# Patient Record
Sex: Male | Born: 1950 | Race: White | Hispanic: No | Marital: Single | Smoking: Former smoker
Health system: Southern US, Community
[De-identification: ages and names within clinical notes are randomized; demographics above are authoritative.]

## PROBLEM LIST (undated history)

## (undated) DIAGNOSIS — I712 Thoracic aortic aneurysm, without rupture, unspecified: Secondary | ICD-10-CM

## (undated) DIAGNOSIS — N400 Enlarged prostate without lower urinary tract symptoms: Secondary | ICD-10-CM

## (undated) DIAGNOSIS — I351 Nonrheumatic aortic (valve) insufficiency: Secondary | ICD-10-CM

## (undated) DIAGNOSIS — J449 Chronic obstructive pulmonary disease, unspecified: Secondary | ICD-10-CM

## (undated) DIAGNOSIS — E785 Hyperlipidemia, unspecified: Secondary | ICD-10-CM

## (undated) HISTORY — DX: Thoracic aortic aneurysm, without rupture, unspecified: I71.20

## (undated) HISTORY — PX: OTHER SURGICAL HISTORY: SHX169

## (undated) HISTORY — DX: Nonrheumatic aortic (valve) insufficiency: I35.1

## (undated) HISTORY — PX: MITRAL VALVE REPAIR: SHX2039

## (undated) HISTORY — PX: AORTIC VALVE REPLACEMENT: SHX41

## (undated) HISTORY — DX: Hyperlipidemia, unspecified: E78.5

## (undated) HISTORY — DX: Thoracic aortic aneurysm, without rupture: I71.2

## (undated) HISTORY — PX: TRANSURETHRAL RESECTION OF PROSTATE: SHX73

## (undated) HISTORY — DX: Chronic obstructive pulmonary disease, unspecified: J44.9

## (undated) HISTORY — PX: HERNIA REPAIR: SHX51

## (undated) HISTORY — DX: Benign prostatic hyperplasia without lower urinary tract symptoms: N40.0

## (undated) HISTORY — PX: APPENDECTOMY: SHX54

---

## 1999-01-29 ENCOUNTER — Ambulatory Visit (HOSPITAL_BASED_OUTPATIENT_CLINIC_OR_DEPARTMENT_OTHER): Admission: RE | Admit: 1999-01-29 | Discharge: 1999-01-29 | Payer: Self-pay | Admitting: *Deleted

## 1999-02-13 ENCOUNTER — Emergency Department (HOSPITAL_COMMUNITY): Admission: EM | Admit: 1999-02-13 | Discharge: 1999-02-14 | Payer: Self-pay | Admitting: Emergency Medicine

## 1999-04-01 ENCOUNTER — Ambulatory Visit (HOSPITAL_COMMUNITY): Admission: RE | Admit: 1999-04-01 | Discharge: 1999-04-01 | Payer: Self-pay | Admitting: Gastroenterology

## 1999-04-01 ENCOUNTER — Encounter: Payer: Self-pay | Admitting: Gastroenterology

## 1999-04-10 ENCOUNTER — Ambulatory Visit (HOSPITAL_COMMUNITY): Admission: RE | Admit: 1999-04-10 | Discharge: 1999-04-10 | Payer: Self-pay | Admitting: Gastroenterology

## 1999-04-10 ENCOUNTER — Encounter: Payer: Self-pay | Admitting: Gastroenterology

## 1999-05-04 ENCOUNTER — Encounter (INDEPENDENT_AMBULATORY_CARE_PROVIDER_SITE_OTHER): Payer: Self-pay

## 1999-05-04 ENCOUNTER — Inpatient Hospital Stay (HOSPITAL_COMMUNITY): Admission: EM | Admit: 1999-05-04 | Discharge: 1999-05-07 | Payer: Self-pay

## 2001-01-10 ENCOUNTER — Encounter: Admission: RE | Admit: 2001-01-10 | Discharge: 2001-01-10 | Payer: Self-pay | Admitting: Family Medicine

## 2001-01-10 ENCOUNTER — Encounter: Payer: Self-pay | Admitting: Family Medicine

## 2001-01-19 ENCOUNTER — Ambulatory Visit (HOSPITAL_COMMUNITY): Admission: RE | Admit: 2001-01-19 | Discharge: 2001-01-19 | Payer: Self-pay | Admitting: Family Medicine

## 2002-01-13 ENCOUNTER — Encounter: Payer: Self-pay | Admitting: Family Medicine

## 2002-01-13 ENCOUNTER — Encounter: Admission: RE | Admit: 2002-01-13 | Discharge: 2002-01-13 | Payer: Self-pay | Admitting: Family Medicine

## 2003-01-15 ENCOUNTER — Encounter: Admission: RE | Admit: 2003-01-15 | Discharge: 2003-01-15 | Payer: Self-pay | Admitting: Family Medicine

## 2003-09-04 ENCOUNTER — Encounter: Admission: RE | Admit: 2003-09-04 | Discharge: 2003-09-04 | Payer: Self-pay | Admitting: Family Medicine

## 2003-09-07 ENCOUNTER — Ambulatory Visit (HOSPITAL_BASED_OUTPATIENT_CLINIC_OR_DEPARTMENT_OTHER): Admission: RE | Admit: 2003-09-07 | Discharge: 2003-09-07 | Payer: Self-pay | Admitting: Urology

## 2003-09-07 ENCOUNTER — Ambulatory Visit (HOSPITAL_COMMUNITY): Admission: RE | Admit: 2003-09-07 | Discharge: 2003-09-07 | Payer: Self-pay | Admitting: Urology

## 2003-09-07 ENCOUNTER — Encounter (INDEPENDENT_AMBULATORY_CARE_PROVIDER_SITE_OTHER): Payer: Self-pay | Admitting: Specialist

## 2003-09-08 ENCOUNTER — Emergency Department (HOSPITAL_COMMUNITY): Admission: EM | Admit: 2003-09-08 | Discharge: 2003-09-08 | Payer: Self-pay | Admitting: Emergency Medicine

## 2003-11-23 ENCOUNTER — Ambulatory Visit: Payer: Self-pay | Admitting: Family Medicine

## 2003-12-13 ENCOUNTER — Ambulatory Visit: Payer: Self-pay | Admitting: Family Medicine

## 2003-12-14 ENCOUNTER — Encounter: Admission: RE | Admit: 2003-12-14 | Discharge: 2003-12-14 | Payer: Self-pay | Admitting: Family Medicine

## 2004-07-09 ENCOUNTER — Ambulatory Visit: Payer: Self-pay | Admitting: Family Medicine

## 2004-12-01 ENCOUNTER — Ambulatory Visit: Payer: Self-pay | Admitting: Family Medicine

## 2004-12-12 ENCOUNTER — Ambulatory Visit: Payer: Self-pay | Admitting: Family Medicine

## 2004-12-12 ENCOUNTER — Encounter: Admission: RE | Admit: 2004-12-12 | Discharge: 2004-12-12 | Payer: Self-pay | Admitting: Family Medicine

## 2004-12-17 ENCOUNTER — Encounter: Payer: Self-pay | Admitting: Internal Medicine

## 2004-12-17 ENCOUNTER — Ambulatory Visit: Payer: Self-pay

## 2004-12-18 ENCOUNTER — Ambulatory Visit: Payer: Self-pay | Admitting: Cardiology

## 2004-12-24 ENCOUNTER — Ambulatory Visit (HOSPITAL_COMMUNITY): Admission: RE | Admit: 2004-12-24 | Discharge: 2004-12-24 | Payer: Self-pay | Admitting: Cardiology

## 2004-12-24 ENCOUNTER — Encounter: Payer: Self-pay | Admitting: Cardiology

## 2004-12-26 ENCOUNTER — Inpatient Hospital Stay (HOSPITAL_BASED_OUTPATIENT_CLINIC_OR_DEPARTMENT_OTHER): Admission: RE | Admit: 2004-12-26 | Discharge: 2004-12-26 | Payer: Self-pay | Admitting: Cardiology

## 2004-12-26 ENCOUNTER — Ambulatory Visit: Payer: Self-pay | Admitting: Cardiology

## 2005-01-09 ENCOUNTER — Encounter
Admission: RE | Admit: 2005-01-09 | Discharge: 2005-01-09 | Payer: Self-pay | Admitting: Thoracic Surgery (Cardiothoracic Vascular Surgery)

## 2005-01-13 ENCOUNTER — Ambulatory Visit: Payer: Self-pay | Admitting: *Deleted

## 2005-01-20 ENCOUNTER — Inpatient Hospital Stay (HOSPITAL_COMMUNITY)
Admission: RE | Admit: 2005-01-20 | Discharge: 2005-01-29 | Payer: Self-pay | Admitting: Thoracic Surgery (Cardiothoracic Vascular Surgery)

## 2005-01-20 ENCOUNTER — Encounter (INDEPENDENT_AMBULATORY_CARE_PROVIDER_SITE_OTHER): Payer: Self-pay | Admitting: Specialist

## 2005-01-20 ENCOUNTER — Ambulatory Visit: Payer: Self-pay | Admitting: Cardiovascular Disease

## 2005-02-02 ENCOUNTER — Ambulatory Visit: Payer: Self-pay | Admitting: Internal Medicine

## 2005-02-09 ENCOUNTER — Ambulatory Visit: Payer: Self-pay | Admitting: Cardiology

## 2005-02-16 ENCOUNTER — Ambulatory Visit: Payer: Self-pay | Admitting: Internal Medicine

## 2005-03-02 ENCOUNTER — Ambulatory Visit: Payer: Self-pay | Admitting: Cardiology

## 2005-03-11 ENCOUNTER — Ambulatory Visit: Payer: Self-pay | Admitting: Cardiology

## 2005-03-25 ENCOUNTER — Ambulatory Visit: Payer: Self-pay | Admitting: Cardiology

## 2005-03-28 ENCOUNTER — Ambulatory Visit: Payer: Self-pay | Admitting: Internal Medicine

## 2005-04-03 ENCOUNTER — Ambulatory Visit: Payer: Self-pay | Admitting: Family Medicine

## 2005-04-27 ENCOUNTER — Ambulatory Visit: Payer: Self-pay | Admitting: Internal Medicine

## 2005-04-27 ENCOUNTER — Ambulatory Visit: Payer: Self-pay | Admitting: Cardiology

## 2005-05-25 ENCOUNTER — Ambulatory Visit: Payer: Self-pay | Admitting: Cardiology

## 2005-06-17 ENCOUNTER — Ambulatory Visit: Payer: Self-pay | Admitting: Cardiology

## 2005-06-30 ENCOUNTER — Ambulatory Visit: Payer: Self-pay | Admitting: Cardiology

## 2005-07-16 ENCOUNTER — Encounter: Payer: Self-pay | Admitting: Cardiology

## 2005-07-16 ENCOUNTER — Ambulatory Visit: Payer: Self-pay

## 2005-12-24 ENCOUNTER — Ambulatory Visit: Payer: Self-pay | Admitting: Family Medicine

## 2005-12-24 LAB — CONVERTED CEMR LAB
ALT: 29 units/L (ref 0–40)
Albumin: 3.8 g/dL (ref 3.5–5.2)
Basophils Relative: 0.1 % (ref 0.0–1.0)
Chloride: 102 meq/L (ref 96–112)
GFR calc non Af Amer: 74 mL/min
Glomerular Filtration Rate, Af Am: 89 mL/min/{1.73_m2}
Glucose, Bld: 96 mg/dL (ref 70–99)
HDL: 58.6 mg/dL (ref >39.0)
LDL Cholesterol: 119 mg/dL — ABNORMAL HIGH (ref 0–99)
MCHC: 33.2 g/dL (ref 30.0–36.0)
MCV: 95.1 fL (ref 78.0–100.0)
Monocytes Absolute: 0.5 10*3/uL (ref 0.2–0.7)
Neutro Abs: 2.3 10*3/uL (ref 1.4–7.7)
Platelets: 214 10*3/uL (ref 150–400)
RBC: 4.43 M/uL (ref 4.22–5.81)
Sodium: 137 meq/L (ref 135–145)
VLDL: 11 mg/dL (ref 0–40)
WBC: 4 10*3/uL — ABNORMAL LOW (ref 4.5–10.5)

## 2005-12-31 ENCOUNTER — Ambulatory Visit: Payer: Self-pay | Admitting: Family Medicine

## 2006-02-12 ENCOUNTER — Encounter
Admission: RE | Admit: 2006-02-12 | Discharge: 2006-02-12 | Payer: Self-pay | Admitting: Thoracic Surgery (Cardiothoracic Vascular Surgery)

## 2006-04-16 ENCOUNTER — Ambulatory Visit: Payer: Self-pay | Admitting: Internal Medicine

## 2006-06-17 ENCOUNTER — Ambulatory Visit: Payer: Self-pay | Admitting: Cardiology

## 2006-08-12 ENCOUNTER — Ambulatory Visit: Payer: Self-pay | Admitting: Family Medicine

## 2006-08-26 DIAGNOSIS — J309 Allergic rhinitis, unspecified: Secondary | ICD-10-CM | POA: Insufficient documentation

## 2006-08-26 DIAGNOSIS — J4489 Other specified chronic obstructive pulmonary disease: Secondary | ICD-10-CM | POA: Insufficient documentation

## 2006-08-26 DIAGNOSIS — J449 Chronic obstructive pulmonary disease, unspecified: Secondary | ICD-10-CM

## 2006-08-26 DIAGNOSIS — N400 Enlarged prostate without lower urinary tract symptoms: Secondary | ICD-10-CM

## 2006-09-03 ENCOUNTER — Telehealth: Payer: Self-pay | Admitting: Family Medicine

## 2006-09-09 DIAGNOSIS — D239 Other benign neoplasm of skin, unspecified: Secondary | ICD-10-CM | POA: Insufficient documentation

## 2006-10-21 ENCOUNTER — Telehealth: Payer: Self-pay | Admitting: Family Medicine

## 2006-10-22 ENCOUNTER — Telehealth (INDEPENDENT_AMBULATORY_CARE_PROVIDER_SITE_OTHER): Payer: Self-pay | Admitting: *Deleted

## 2006-11-04 ENCOUNTER — Ambulatory Visit: Payer: Self-pay | Admitting: Family Medicine

## 2006-11-04 DIAGNOSIS — K4090 Unilateral inguinal hernia, without obstruction or gangrene, not specified as recurrent: Secondary | ICD-10-CM | POA: Insufficient documentation

## 2006-11-04 DIAGNOSIS — M545 Low back pain, unspecified: Secondary | ICD-10-CM | POA: Insufficient documentation

## 2006-12-27 ENCOUNTER — Ambulatory Visit: Payer: Self-pay | Admitting: Family Medicine

## 2006-12-27 LAB — CONVERTED CEMR LAB
AST: 30 units/L (ref 0–37)
Alkaline Phosphatase: 53 units/L (ref 39–117)
BUN: 16 mg/dL (ref 6–23)
Basophils Relative: 0.1 % (ref 0.0–1.0)
CO2: 29 meq/L (ref 19–32)
Chloride: 101 meq/L (ref 96–112)
Creatinine, Ser: 1 mg/dL (ref 0.4–1.5)
HCT: 40.8 % (ref 39.0–52.0)
Hemoglobin: 14.1 g/dL (ref 13.0–17.0)
Ketones, urine, test strip: NEGATIVE
LDL Cholesterol: 131 mg/dL — ABNORMAL HIGH (ref 0–99)
Monocytes Absolute: 0.5 10*3/uL (ref 0.2–0.7)
Neutrophils Relative %: 53.4 % (ref 43.0–77.0)
Nitrite: NEGATIVE
Potassium: 4.7 meq/L (ref 3.5–5.1)
RDW: 12.7 % (ref 11.5–14.6)
TSH: 1.41 microintl units/mL (ref 0.35–5.50)
Total Bilirubin: 1.1 mg/dL (ref 0.3–1.2)
Total Protein: 6.7 g/dL (ref 6.0–8.3)
Urobilinogen, UA: 0.2
VLDL: 14 mg/dL (ref 0–40)
WBC Urine, dipstick: NEGATIVE

## 2006-12-29 ENCOUNTER — Telehealth (INDEPENDENT_AMBULATORY_CARE_PROVIDER_SITE_OTHER): Payer: Self-pay | Admitting: *Deleted

## 2006-12-30 ENCOUNTER — Telehealth: Payer: Self-pay | Admitting: Family Medicine

## 2007-01-03 ENCOUNTER — Ambulatory Visit (HOSPITAL_COMMUNITY): Admission: RE | Admit: 2007-01-03 | Discharge: 2007-01-04 | Payer: Self-pay | Admitting: General Surgery

## 2007-01-10 ENCOUNTER — Ambulatory Visit: Payer: Self-pay | Admitting: Family Medicine

## 2007-01-11 ENCOUNTER — Ambulatory Visit: Payer: Self-pay | Admitting: Family Medicine

## 2007-03-18 ENCOUNTER — Telehealth (INDEPENDENT_AMBULATORY_CARE_PROVIDER_SITE_OTHER): Payer: Self-pay | Admitting: *Deleted

## 2007-03-24 ENCOUNTER — Ambulatory Visit: Payer: Self-pay | Admitting: Internal Medicine

## 2007-04-14 ENCOUNTER — Telehealth: Payer: Self-pay | Admitting: Family Medicine

## 2007-04-14 ENCOUNTER — Encounter: Payer: Self-pay | Admitting: Cardiology

## 2007-06-28 ENCOUNTER — Ambulatory Visit: Payer: Self-pay | Admitting: Cardiology

## 2007-07-05 ENCOUNTER — Encounter: Payer: Self-pay | Admitting: Cardiology

## 2007-07-05 ENCOUNTER — Ambulatory Visit: Payer: Self-pay | Admitting: Cardiology

## 2007-09-05 IMAGING — CR DG CHEST 2V
2 series · 2 of 2 positions shown · non-contrast
Comparison: 12/14/03.

CLINICAL DATA: Irregular heartbeat.
 CHEST X-RAY:

[view not recorded (1 of 2)]
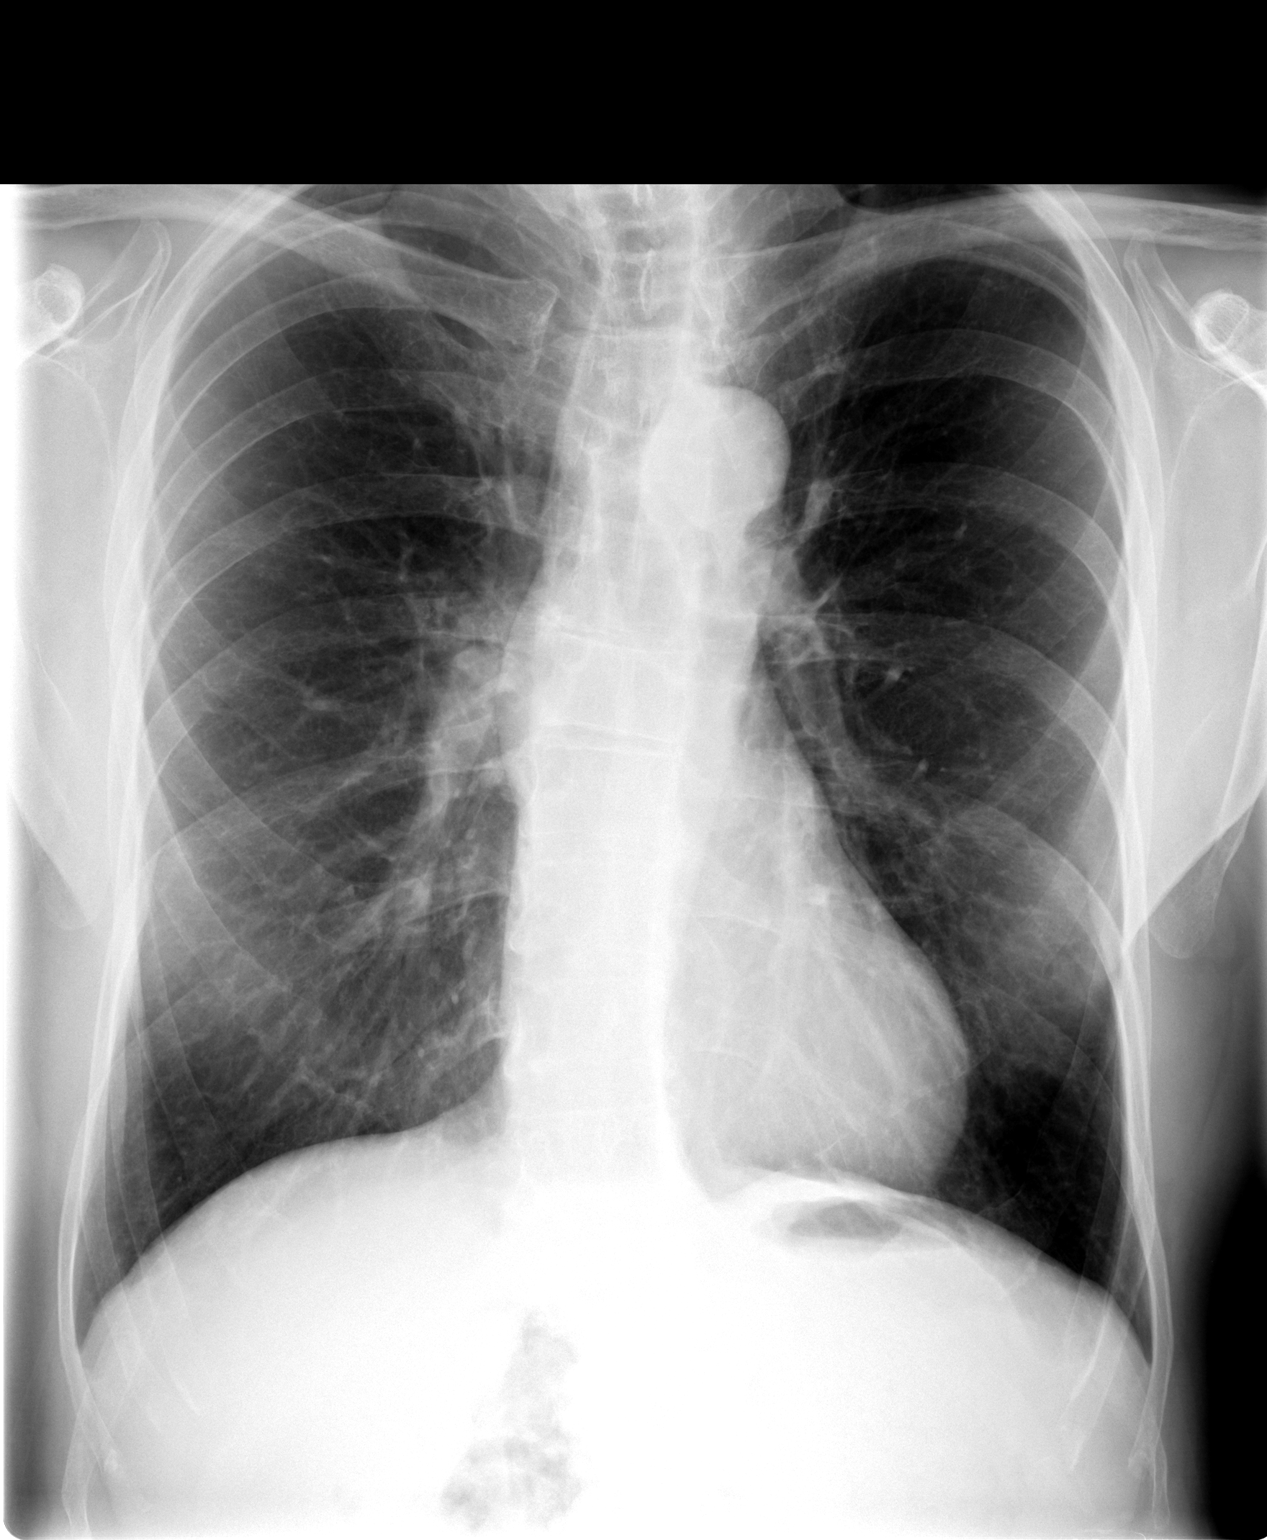

[view not recorded (2 of 2)]
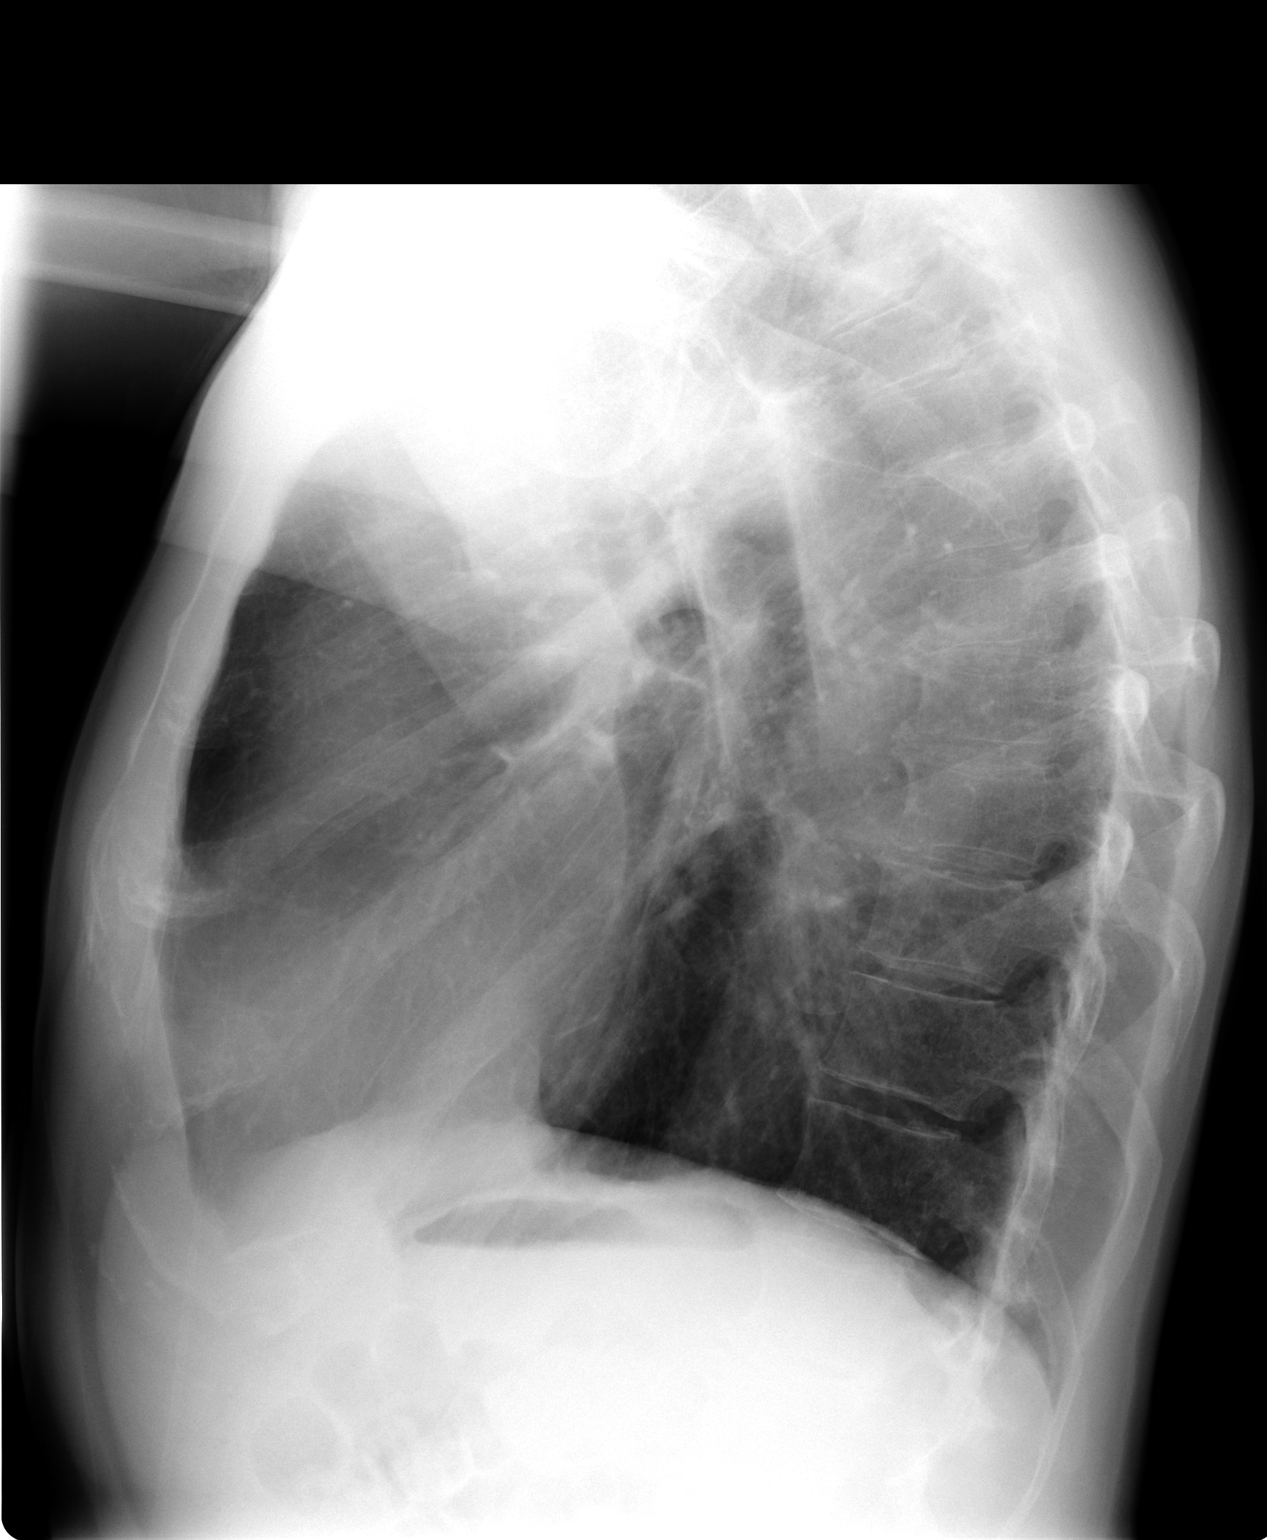

[2 of 2 positions shown; findings below may reference images not displayed]

Two views of the chest show the lungs to be hyperaerated consistent with COPD.  The heart is within normal limits in size.  There is a mild thoracolumbar scoliosis present.
IMPRESSION: COPD.  No active lung disease.

## 2007-10-18 ENCOUNTER — Ambulatory Visit: Payer: Self-pay | Admitting: Family Medicine

## 2008-01-03 ENCOUNTER — Ambulatory Visit: Payer: Self-pay | Admitting: Family Medicine

## 2008-01-03 LAB — CONVERTED CEMR LAB
ALT: 32 units/L (ref 0–53)
AST: 28 units/L (ref 0–37)
Albumin: 3.9 g/dL (ref 3.5–5.2)
Alkaline Phosphatase: 55 units/L (ref 39–117)
BUN: 18 mg/dL (ref 6–23)
Basophils Relative: 0 % (ref 0.0–3.0)
Blood in Urine, dipstick: NEGATIVE
CO2: 31 meq/L (ref 19–32)
Chloride: 101 meq/L (ref 96–112)
Creatinine, Ser: 0.9 mg/dL (ref 0.4–1.5)
Direct LDL: 122.5 mg/dL
Eosinophils Relative: 1.1 % (ref 0.0–5.0)
GFR calc non Af Amer: 92 mL/min
HDL: 58.1 mg/dL (ref >39.0)
Lymphocytes Relative: 31.2 % (ref 12.0–46.0)
Monocytes Relative: 10.2 % (ref 3.0–12.0)
Neutrophils Relative %: 57.5 % (ref 43.0–77.0)
Nitrite: NEGATIVE
Platelets: 179 10*3/uL (ref 150–400)
Potassium: 3.8 meq/L (ref 3.5–5.1)
Protein, U semiquant: NEGATIVE
RBC: 4.23 M/uL (ref 4.22–5.81)
Specific Gravity, Urine: 1.02
Total CHOL/HDL Ratio: 3.5
Triglycerides: 72 mg/dL (ref 0–149)
Urobilinogen, UA: 0.2
VLDL: 14 mg/dL (ref 0–40)
WBC Urine, dipstick: NEGATIVE
WBC: 3.9 10*3/uL — ABNORMAL LOW (ref 4.5–10.5)

## 2008-01-09 ENCOUNTER — Encounter: Payer: Self-pay | Admitting: Family Medicine

## 2008-01-10 ENCOUNTER — Encounter: Payer: Self-pay | Admitting: Family Medicine

## 2008-01-10 ENCOUNTER — Ambulatory Visit: Payer: Self-pay | Admitting: Family Medicine

## 2008-01-10 DIAGNOSIS — I059 Rheumatic mitral valve disease, unspecified: Secondary | ICD-10-CM | POA: Insufficient documentation

## 2008-02-09 ENCOUNTER — Ambulatory Visit: Payer: Self-pay | Admitting: Family Medicine

## 2008-02-17 ENCOUNTER — Telehealth: Payer: Self-pay | Admitting: Family Medicine

## 2008-04-16 ENCOUNTER — Telehealth: Payer: Self-pay | Admitting: Family Medicine

## 2008-06-20 ENCOUNTER — Ambulatory Visit: Payer: Self-pay | Admitting: Cardiology

## 2008-06-20 DIAGNOSIS — E785 Hyperlipidemia, unspecified: Secondary | ICD-10-CM | POA: Insufficient documentation

## 2008-06-20 DIAGNOSIS — I359 Nonrheumatic aortic valve disorder, unspecified: Secondary | ICD-10-CM | POA: Insufficient documentation

## 2008-06-22 ENCOUNTER — Encounter: Payer: Self-pay | Admitting: Cardiology

## 2008-06-22 ENCOUNTER — Ambulatory Visit: Payer: Self-pay

## 2008-11-09 ENCOUNTER — Telehealth (INDEPENDENT_AMBULATORY_CARE_PROVIDER_SITE_OTHER): Payer: Self-pay | Admitting: *Deleted

## 2010-02-02 ENCOUNTER — Encounter: Payer: Self-pay | Admitting: Thoracic Surgery (Cardiothoracic Vascular Surgery)

## 2010-05-27 NOTE — Assessment & Plan Note (Signed)
[facility]                            CARDIOLOGY OFFICE NOTE   [name], [name]                      MRN:          [id number]  DATE:06/28/2007                            DOB:          [birth year]    PRIMARY CARE PHYSICIAN:  Salvatore Decent. Cornelius Moras, M.D.   REASON FOR PRESENTATION:  The patient with mitral valve repair.   HISTORY OF PRESENT ILLNESS:  The patient is a pleasant 60 year old  gentleman with a history of mitral valve regurgitation, status post  repair.  He also had atrial fibrillation.  From a cardiovascular stand  point, he has been doing well.  He occasionally gets a sensation that he  cannot take in deep breath, like he has to yawn or sigh.  However, this  was not dyspnea with exertion.  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  He does not have any chest  pressure, neck or arm discomfort.  His has not noticed any palpitation,  presyncope or syncope.   PAST MEDICAL HISTORY:  Mitral valve status post repair (January 20, 2005  by Dr. Cornelius Moras, with a quadrangular resection of the posterior leaflet with  sliding leaflet annuloplasty, triangular plication of the anterior  leaflet and a #32 Medtronic ring annuloplasty), atrial fibrillation  status post modified Cox Maze procedure, benign prostatic hypertrophy,  laminectomy, vasectomy, appendectomy, and right inguinal hernia x2, and  mild aortic root dilatation.   ALLERGIES:  None.   MEDICATIONS:  1. Aspirin 81 mg daily.  2. Saw Palmetto.  3. Multivitamin.  4. Flomax 0.4 mg every other day.   REVIEW OF SYSTEMS:  As stated in the HPI and otherwise negative for  other systems.   PHYSICAL EXAMINATION:  The patient is in no distress. Blood pressure  129/80, heart rate 69 and regular, weight 173 pounds, and body mass  index 27.  HEENT:  Eyes unremarkable, pupils equal, round, and  react to light.  Fundi not visualized.  Oral mucosa unremarkable.  NECK:  No jugular venous distension at 45 degrees, carotid upstroke  brisk and symmetrical.  No bruit.  No thyromegaly.  LYMPHATICS:  No cervical, axillary, or inguinal adenopathy.  LUNGS:  Clear to auscultation bilaterally.  BACK:  No costovertebral angle tenderness.  CHEST:  Unremarkable.  HEART:  PMI not displaced or sustained.  S1 and S2 within normal limits.  No S3, no S4, 3/6 apical systolic murmur slightly delayed in systole,  radiating slightly to the axilla, no diastolic murmurs.  ABDOMEN:  Flat, positive bowel sounds, normal in frequency and pitch.  No bruits, rebound, or guarding.  No midline pulsatile mass.  No  hepatomegaly, or splenomegaly.  SKIN:  No rashes.  No nodules.  EXTREMITIES:  Pulses 2+ throughout, no edema, no cyanosis or clubbing.  NEURO:  Alert and oriented x3.  Cranial nerves II through XII grossly  intact.  Motor, grossly intact.   EKG (done in April), sinus rhythm, incomplete right bundle-branch block,  anterior  T-wave inversions, this was slightly changed from his 2008 EKG.   ASSESSMENT AND PLAN:  1. Mitral regurgitation.  The patient's murmur is slightly louder than      it was described by Dr. Samule Ohm.  I have not heard this murmur      before.  It has been 2 years since his last echocardiogram.  I will      go ahead and repeat an echocardiogram to further evaluate his      mitral regurgitation.  This will also allow me to size his aortic      root to make sure there has been no significant change.  He did      have a CT in 2008, which demonstrated to be slightly enlarged at      4.1 cm.  2. Dyspnea.  The patient describes having to take a deep breath, but      not overt dyspnea on exertion, PND, or orthopnea.  I doubt that      this is related to his cardiac problems.  I am going to encourage      him to slowly increase his aerobic activity and train himself and I      think his  stamina and breathing will improve.  3. Atrial fibrillation.  He has had no paroxysms of this.  He was      taken off the Coumadin by Dr. Samule Ohm.  4. Followup.  I will see him back yearly unless there is a problem      with the echocardiogram.     Rollene Rotunda, MD, [facility]  Electronically Signed    JH/MedQ  DD: 06/28/2007  DT: 06/29/2007  Job #: [id number]   cc:   Tinnie Gens A. Tawanna Cooler, MD

## 2010-05-27 NOTE — Assessment & Plan Note (Signed)
[facility]                            CARDIOLOGY OFFICE NOTE   [name], [name]                      MRN:          [id number]  DATE:06/17/2006                            DOB:          [birth year]    PRIMARY CARE PHYSICIAN:  Dr. Alonza Smoker.   HISTORY OF PRESENT ILLNESS:  [name] is a 60 year old gentleman,  status post mitral valve repair, mitral annuloplasty, and modified Cox-  Maze procedure by Dr. Cornelius Moras in December 2006.  His postoperative course  has been uncomplicated.  He has had no recurrence of the atrial  fibrillation after the surgery.  We obtained a Holter monitor a year ago  that showed no atrial fibrillation.  He has certainly had no  palpitations.  He further denies any exertional dyspnea.   CURRENT MEDICATIONS:  1. Aspirin 81 mg daily.  2. Saw palmetto.  3. Multivitamin.  4. Flomax.  5. Proscar.   EXAMINATION:  He is generally well-appearing, in no distress with a  heart rate of 74, blood pressure 108/82, and weight of 172 pounds.  He  has no jugular venous distention, thyromegaly, or lymphadenopathy.  LUNGS:  Clear to auscultation and respiratory effort is normal.  He has a nondisplaced point of maximal cardiac impulse.  There is a  regular rate and rhythm with a 1/6 holosystolic murmur heard at the apex  only.  There is no click, S3, or S4.  ABDOMEN:  Soft, nondistended, nontender.  There is no  hepatosplenomegaly.  Bowel sounds are normal.  EXTREMITIES:  Warm without clubbing, cyanosis, edema, or ulceration.  Carotid pulses are 2+ bilaterally without bruit.   Electrocardiogram demonstrates normal sinus rhythm with left anterior  fascicular block.  There is no change compared with prior.   IMPRESSION/RECOMMENDATION:  1. Status post mitral valve repair:  Doing very nicely.  He does not      need antibiotic prophylaxis for dental work.  2. Paroxysmal atrial fibrillation:  He has remained in sinus rhythm      since his  surgery.  Therefore no need for Coumadin.     Salvadore Farber, MD  Electronically Signed    WED/MedQ  DD: 06/17/2006  DT: 06/17/2006  Job #: [id number]   cc:   Tinnie Gens A. Tawanna Cooler, MD

## 2010-05-27 NOTE — Op Note (Signed)
NAME:  [name], [name]               ACCOUNT NO.:  [id number]   MEDICAL RECORD NO.:  [id number]          PATIENT TYPE:  AMB   LOCATION:  DAY                          FACILITY:  [facility]   PHYSICIAN:  Anselm Pancoast. Weatherly, M.D.DATE OF BIRTH:  28-Apr-1950   DATE OF PROCEDURE:  01/03/2007  DATE OF DISCHARGE:                               OPERATIVE REPORT   PREOPERATIVE DIAGNOSIS:  Left inguinal hernia, probable direct.   PROCEDURE:  Left inguinal herniorrhaphy with mesh, Lichtenstein repair.   ANESTHESIA:  General.  LOA tube.   SURGEON:  Anselm Pancoast. Zachery Dakins, M.D.   ASSISTANT:  Nurse.   HISTORY:  [name] is a 60 year old male who Dr. Colin Benton had done a  previous right inguinal hernia laparoscopically, and then he has had a  laparoscopic appendectomy.  He was scheduled for a left open inguinal  hernia repair today.  With her absence, I was asked if I would do the  procedure.  I met the patient in the office and examined him and agreed  with the planned procedure.  We think that this is a direct inguinal  hernia, and we will use Prolene mesh after repairing the hernia.   DESCRIPTION OF PROCEDURE:  The patient was taken to the operative suite.  He was given a gram of Ancef.  He has PAS stockings in position on the  OR table.  We then induced anesthesia with an LOA tube, and the left  groin was first clipped and then prepped with Betadine solution and  draped in a sterile manner.  The timeout procedure was done.  The  patient had been marked and identified, etc.   I then made a left inguinal incision with sharp dissection down through  the skin and subcutaneous tissue.  There were a couple of superficial  veins that were quite large that were clamped and ligated with 4-0  Vicryl, and then the external oblique aponeurosis was opened through the  external inguinal ring.  The ilioinguinal nerve was identified and  placed with the cord structures, and you could see that this was  definitely a direct inguinal hernia with no evidence of an indirect  component.  I first elevated the cord structures and then carefully  dissected the hernia sac from where it was lying up against the cord  structures and took it back to the epigastric vessels.  I elected to  invaginate the hernia sac and not actually excise it since it was just  peritoneum and found the lateral edge of the conjoined tendon and  sutured this to the inguinal ligament with a running 2-0 Prolene,  recreating the internal inguinal ring and then taking it back and tying  the two ends together.  Next, a piece of Prolene mesh shaped like a sail  slit laterally was used to reinforce the floor.  I sutured it to the  inguinal ligament inferiorly and then the two tails going around the new  created internal ring, and the ilioinguinal nerve was with it.  The  little hypogastric was high enough that I could not place the mesh over  it.  The superior flap was sutured down with interrupted sutures of 2-0  Vicryl or 2-0 Prolene, and it was lying flat without excessive tension.  I had placed some Marcaine in the floor and also a little lateral for  immediate postoperative pain control management.  The external oblique  was closed with a 2-0 Vicryl.  Scarpa's fascia was closed with  interrupted 3-0 Vicryl, 4-0 Dexon subcuticular, and benzoin and Steri-  Strips on the skin.   The patient tolerated the procedure nicely and will be released after a  short stay in the recovery room.  He understands that he should be on  limited activity for approximately 3 weeks, and I will see him back in  the office in approximately 2 weeks, earlier if he has any problems.  I  think Dr. Alonza Smoker is his regular physician.           ______________________________  Anselm Pancoast. Zachery Dakins, M.D.     WJW/MEDQ  D:  01/03/2007  T:  01/03/2007  Job:  [id number]

## 2010-05-30 NOTE — Cardiovascular Report (Signed)
[name], [name]               ACCOUNT NO.:  [id number]   MEDICAL RECORD NO.:  [id number]          PATIENT TYPE:  OIB   LOCATION:  1963                         FACILITY:  [facility]   PHYSICIAN:  Rollene Rotunda, M.D.   DATE OF BIRTH:  [birth year]   DATE OF PROCEDURE:  12/26/2004  DATE OF DISCHARGE:  12/26/2004                              CARDIAC CATHETERIZATION   CARDIOLOGIST:  Salvadore Farber, M.D. [facility]   PROCEDURE:  Left and right heart catheterization and aortic root.   INDICATION:  Mitral regurgitation.   PROCEDURE NOTE:  Left and right heart catheterization were performed via the  right femoral artery and vein, respectively.  Both vessels were cannulated  using an antral wall puncture.  A #4 French arterial sheath and a #7 [place]  venous sheath were inserted via the modified Seldinger technique.  Pre-  formed Judkins, pigtail, Amplatz, left catheter and a Swan-Ganz catheter  were utilized.  The right coronary artery had a high anterior take off and  was easily cannulated with an Amplatz left catheter after the take off was  visualized with an aortic root injection.  I had difficulty crossing the  aortic valve for unclear reasons.  I eventually had to use a right coronary  catheter with a straight wire followed by long exchange wire.   RESULTS:  Hemodynamics:  RA mean 7, RV 20/4, PA 17/4 with a mean of 10.  Pulmonary capillary wedge pressure mean of 9 (no V-wave), cardiac  output/cardiac index 4.4/2.2 (FICK), LV 124/14, ____________ 112/68.  Coronaries:  Left main was normal.  The LAD had minimal luminal  irregularities.  First and second diagonal were small and normal.  Circumflex in the AV groove was normal.  OM1 was small and normal.  OM2 was  large and normal.  Right coronary artery was dominant with high anterior  wall take off.  There was minimal plaque.  The PDA was moderate sized and  normal.   Left ventriculogram:  The left ventriculogram was obtained in the RAO  projection.  There was mild global hypokinesis with an ejection fraction  overall relatively well preserved at 50-55%.  There was posterior mitral  valve leaflet prolapse.  However, the regurgitation from the ventriculogram  appeared to be only 1-2+.  Aortic root:  An aortic root was obtained  secondary to difficulty cannulating the right coronary.  It confirmed the  high anterior wall takeoff and no other significant abnormalities.   CONCLUSION:  Minimal coronary plaque.  Moderate appearing mitral  regurgitation with prolapsing mitral valve, mildly reduced left ventricular  function, normal right heart pressures.   PLAN:  These results will be reviewed with Dr. Samule Ohm and Dr. Cornelius Moras to  further help with the decision on the timing of mitral valve repair.           ______________________________  Rollene Rotunda, M.D.    JH/MEDQ  D:  12/26/2004  T:  12/29/2004  Job:  [id number]

## 2010-05-30 NOTE — Consult Note (Signed)
[name], [name]               ACCOUNT NO.:  [id number]   MEDICAL RECORD NO.:  [id number]          PATIENT TYPE:  INP   LOCATION:  2310                         FACILITY:  [facility]   PHYSICIAN:  Zenon Mayo, MDDATE OF BIRTH:  1950/02/17   DATE OF CONSULTATION:  01/20/2005  DATE OF DISCHARGE:                                   CONSULTATION   DESCRIPTION:  [name] is a 60 year old gentleman with history of  progressive worsening of shortness of breath who was evaluated and found to  have mitral regurgitation. He also has a history of atrial fibrillation and  dilated left atrium. [name] was brought to the operating room today by  Dr. Cornelius Moras for repair or replacement of his mitral valve as well as a Maze  procedure for correction of his atrial fibrillation. The patient was brought  to the operating room and placed under general anesthesia. After  confirmation of endotracheal tube placement, a transesophageal  echocardiogram probe was placed in the patient's esophagus without  complication. The left ventricle was imaged first and revealed normal wall  thickness. There were no segmental wall motion abnormalities noted. The  ejection fraction was estimated to be 55%. The left ventricle, was mildly to  moderately dilated. The mitral valve was next evaluated. The anterior mitral  leaflet appeared thickened. The posterior leaflet revealed billowing into  the left atrium. The leaflets did not appear to coapt. When color Doppler  was placed across the valve, both a large central jet as well as eccentric  jet passing over the anterior leaflet were noted. The mitral regurgitation  jet was classified as severe in nature. There was no mitral annular  calcification noted. It was difficult to assess whether there was reversal  of flow into the pulmonic veins. The aorta was evaluated next. The aortic  valve was trileaflet in nature. There were no calcifications or vegetation  seen. The  aortic valve area was 6.7 cm2. There was mild aortic insufficiency  noted within an eccentric jet located between the left and noncoronary  cusps. The aortic root was significantly dilated as well with annular  measurement of 31 mm, the sinuses Valsalva measured 48 mm, and sinotubular  ridge measured 39 mm. The ascending aorta itself measured 40 mm. The  tricuspid and pulmonic valves were both normal in appearance with trace  amounts of regurgitation seen. The interatrial septum was intact and the  thoracic aorta revealed no atherosclerotic disease.   At the conclusion of bypass, the heart was again evaluated with  echocardiogram. A ring had been placed in the mitral position and appeared  to be seen well. There was no mitral regurgitation noted at this time. The  aortic insufficiency remained mild in nature and continued to have an  eccentric jet. All other structures and functions of the heart remained  unchanged from prebypass evaluation. The patient maintained adequate blood  pressure and cardiac outputs without having been placed on any inotropic  support. The patient remained stable throughout his post bypass. At the end  of the procedure, the transesophageal echocardiogram probe was removed  without any evidence of trauma. The patient was taken directly from the  operating room to the intensive care unit in stable condition.           ______________________________  Zenon Mayo, MD    WEF/MEDQ  D:  01/20/2005  T:  01/20/2005  Job:  [id number]   cc:   Patient's chart   Anesthesia Office

## 2010-05-30 NOTE — Consult Note (Signed)
NAMEGIULIANO, PREECE                         ACCOUNT NO.:  [id number]   MEDICAL RECORD NO.:  [id number]                   PATIENT TYPE:  EMS   LOCATION:  ED                                   FACILITY:  [facility]   PHYSICIAN:  Maretta Bees. Vonita Moss, M.D.             DATE OF BIRTH:  15-Aug-1950   DATE OF CONSULTATION:  DATE OF DISCHARGE:                                   CONSULTATION   HISTORY OF PRESENT ILLNESS:  This 60 year old white male is a patient of Dr.  Ihor Gully and underwent cystoscopy, ureteroscopy, and biopsy yesterday  and he called me earlier this morning complaining about a lot of bladder  pain and pressure and trouble voiding, so I called him in Urocet and Flomax.  His pain got worse so he came to the emergency room where I saw him and  Foley catheter was inserted with return of 1000 cc of slightly blood-tinged  urine with significant relief of his pain and discomfort.   PHYSICAL EXAMINATION:  GENERAL:  He was much more comfortable, alert and  oriented.  ABDOMEN:  Now soft and nontender with a Foley catheter in.  GENITALIA:  Penis, urethra, meatus, scrotum, and testicles, and epididymes  without lesions.   IMPRESSION:  1. Postoperative urinary retention.  2. Hematuria from biopsy.  3. Renal blading with pathology pending.   PLAN:  He will continue on Flomax daily. He will use Vicodin for pain.  He  will use Urocet 1-2 q.i.d. over the next couple days until he takes out the  catheter per my instructions on Monday morning, August 29, at which time he  will return to the office if he cannot void but otherwise return for follow-  up per Dr. Cleon Gustin instructions.                                               Maretta Bees. Vonita Moss, M.D.    LJP/MEDQ  D:  09/08/2003  T:  09/09/2003  Job:  [id number]

## 2010-05-30 NOTE — Discharge Summary (Signed)
[name], [name]               ACCOUNT NO.:  [id number]   MEDICAL RECORD NO.:  [id number]          PATIENT TYPE:  INP   LOCATION:  2030                         FACILITY:  [facility]   PHYSICIAN:  Salvatore Decent. Cornelius Moras, M.D. DATE OF BIRTH:  Jun 07, 1950   DATE OF ADMISSION:  01/20/2005  DATE OF DISCHARGE:                                 DISCHARGE SUMMARY   PRINCIPAL DIAGNOSES:  1.  Mitral valve prolapse with severe mitral regurgitation.  2.  Persistent atrial fibrillation.   IN-HOSPITAL DIAGNOSES:  1.  Acute blood loss anemia.  2.  Volume overload.   SECONDARY DIAGNOSES:  1.  Benign prostatic hypertrophy.  2.  History of gross hematuria in 2005, resolved.  3.  Status post lumbar laminectomy and vasectomy approximately 20 years ago.  4.  Status post appendectomy.  5.  Status post right inguinal hernia repair.   ALLERGIES:  No known drug allergies.   IN-HOSPITAL OPERATION/PROCEDURE:  1.  Mitral valve repair using a quadrangular resection of the posterior      leaflet with sliding leaflet annuloplasty, triangular plication of the      anterior leaflet, 32-mm Medtronic ECG future ring annuloplasty.  2.  Modified Cox Maze IV procedure.  3.  Transesophageal echocardiogram.   HISTORY AND PHYSICAL/HOSPITAL COURSE:  The patient is a 60-year-old gentleman  followed by Dr. Tawanna Cooler, seen by Dr. Samule Ohm, for management of her mitral valve  prolapse with mitral regurgitation and atrial fibrillation. The patient  recently developed vague shortness of breath with activity and a new heart  murmur was noted on physical exam by Dr. Tawanna Cooler. The patient underwent  echocardiogram on December 17, 2004, demonstrating mitral valve prolapse with  moderate to severe mitral regurgitation and dilatation of the left atrium.  The patient then underwent transesophageal echocardiogram on December 24, 2004, confirming the presence of maximized mitral valve disease with  bileaflet prolapse and moderate to severe mitral  regurgitation. There was  also somewhat dilated aortic root with only a trace aortic insufficiency.  The patient underwent cardiac catheterization in December 2006 which  confirmed the presence of mitral regurgitation. There was no significant  coronary artery disease noted. Dr. Cornelius Moras was consulted. Dr. Cornelius Moras discussed  with patient undergoing repair of the mitral valve. He also discussed with  patient doing a Maze procedure at the same time for his persistent atrial  fibrillation. Dr. Cornelius Moras discussed risks and benefits of this procedure. The  patient acknowledged understanding and wished to procedure. Surgery was  scheduled for January 20, 2005. Prior to undergoing surgery the patient  underwent bilateral carotid duplex ultrasound which showed no significant IC  stenosis.   The patient was taken to the operating room on January 20, 2005, where he  underwent median sternotomy for mitral valve repair, with a quadrangular  resection posterior leaflet with sliding leaflet annuloplasty, triangular  plication anterior leaflet, 32-mm Medtronic ECG future ring annuloplasty.  The patient also underwent modified Cox Maze 4 procedure. The patient  tolerated this procedure well and was transferred up to the intensive care  unit in stable condition. Immediately following surgery the  patient was  deemed to be stable. He was extubated on the evening of surgery and was  doing well. The patient was A-paced at 90 the evening of surgery. Following  surgery the patient's chest tubes and lines were discontinued in the normal  fashion. There is a question of bleeding on postoperative day one due to the  patient dumping 500 mL of old blood. Chest x-ray was obtained prior to the  chest tubes being discontinued, showing a right pleural opacity which was  noted in the prior exam. The patient's hemoglobin and hematocrit had been  unchanged. Chest tubes were discontinued as ordered. The patient did develop  acute blood  loss anemia during his hospital stay. It dropped down to 7.7 and  22.0 on postoperative day four. He received one unit of packed red blood  cells. It increased appropriately on postoperative day five to 8.3 and 24.1.  H&H was stable prior to discharge. The patient also developed volume  overload for which he started on diuretics for. Last weight was 178.6 and  his perioperative weight was 169.  Plan to discharge home on diuretics.  Following the Maze procedure the patient remained in normal sinus rhythm. He  did require a pacing for several days postoperatively, but remained in  normal sinus rhythm. The patient was started back on his amiodarone. The  patient was on normal sinus rhythm at discharge. The patient was started on  Coumadin postoperatively for the mitral ring. PT-INR were obtained during  this hospital stay and Coumadin was adjusted appropriately. The patient was  near/therapeutic prior to discharge home.  The patient's incisions were dry,  intact, and healing well postoperatively. He was out of bed and ambulating  well. He did develop nausea postoperatively. This was treated with Phenergan  and the patient was free of nausea prior to discharge home, tolerating diet  well. The patient was able to be weaned off oxygen saturating greater than  90% on room air.   The patient is tentatively ready for discharge home on postoperative day  eight, January 28, 2005. A follow-up appointment has been scheduled with Dr.  Cornelius Moras for February 23, 2005, at 2:30 p.m.  The patient will need to contact  Dr. Melinda Crutch office, schedule a follow-up appointment with him in two weeks.  The patient will have a PA and lateral chest x-ray at this appointment which  she will then bring with him to Dr. Orvan July appointment. Mr. Walthall received  instructions on diet, activity level, and incisional care. He was told no driving until released to do so, no heavy lifting over 10 pounds. The  patient is told he is  allowed to shower, washing his incisions using soap  and water. He is to contact the office if he develops any drainage or  opening from any of his incision sites. The patient is told to ambulate 3-4  times per day, to progress as tolerated, and to continue his breathing  exercises. He was educated on diet to be low fat, low salt. He acknowledges  understanding.   DISCHARGE MEDICATIONS:  1.  Aspirin 325 mg daily.  2.  Amiodarone 200 mg daily.  3.  Flomax 0.4 mg daily.  4.  Coumadin will be dosed prior to patient's discharge PT-INR levels. The      patient's Coumadin dosing will be managed by Dr. Samule Ohm and the patient      will need to obtain a PT-INR two days following discharge at the [facility].  5.  Lasix 40 mg daily times five days.  6.  Potassium chloride 20 mEq daily times five days.  7.  Multivitamin daily.  8.  Ultram 50 mg one to two tabs p.o. q.4-6h. p.r.n. pain.      Theda Belfast, PA      Salvatore Decent. Cornelius Moras, M.D.  Electronically Signed    KMD/MEDQ  D:  01/27/2005  T:  01/27/2005  Job:  [id number]   cc:   Salvadore Farber, M.D. [facility]  [address]  [address]  [place]  [place] [address]

## 2010-05-30 NOTE — Op Note (Signed)
[place]. [facility]  Patient:    [name]                       MRN: [id number] Proc. Date: 01/29/99 Adm. Date:  [id number] Attending:  Stephenie Acres                           Operative Report  PREOPERATIVE DIAGNOSIS:  Right inguinal hernia.  POSTOPERATIVE DIAGNOSIS:  Right inguinal hernia.  PROCEDURE:  Laparoscopic right inguinal hernia repair with mesh.  SURGEON:  Catalina Lunger, M.D.  ANESTHESIA:  General.  DESCRIPTION OF PROCEDURE:  The patient was taken to the operating room and placed in supine position.  After adequate anesthesia was induced using endotracheal tube, the abdomen was prepped and draped in normal sterile fashion.  Using a transverse infraumbilical incision, I dissected down into the right anterior rectus fascia. This was opened transversely with a small incision.  All right rectus muscle fibers were retracted laterally.  Using blunt dissection a space was created using a Kelly clamp down through the pubic tubercle.  Dissecting balloon was then placed within the space.  Under direct visualization he was insufflated manually.  Epigastric  vessels were seen, as well as the direct hernia defect.  This was accomplished,  again without difficulty.  The balloon was removed and the operating balloon was placed.  After pneumoperitoneum was obtained, two 5 mm trocars were placed just  left of the midline under direct visualization.  Coopers ligament was cleared, s well as the pubic tubercle.  A direct hernia defect was identified and pseudosac was dissected free of its surrounding structures.  Epigastric vessels were identified.  The spermatic cord was identified and inspected.  There was no evidence of indirect hernia defect.  After all structures were identified a piece of 4 x 6 mesh was then placed.  It was tacked to Coopers ligament anteriorly to the entire abdominal musculature, both medially and  laterally to the epigastric vessels, out to the anterior superior iliac spine.  Pneumoperitoneum was released. Dissecting balloon was removed.  Fascial defect was closed using 0 Vicryl suture. Skin incisions were closed with subcuticular 4.0 monocryl.  Steri-Strips and sterile dressings were applied.  The patient tolerated the procedure well.  Went to PACU in good condition. DD:  01/29/99 TD:  01/30/99 Job: [id number]

## 2010-05-30 NOTE — Op Note (Signed)
[name], [name]               ACCOUNT NO.:  [id number]   MEDICAL RECORD NO.:  [id number]          PATIENT TYPE:  INP   LOCATION:  2310                         FACILITY:  [facility]   PHYSICIAN:  Salvatore Decent. Cornelius Moras, M.D. DATE OF BIRTH:  05/30/1950   DATE OF PROCEDURE:  01/20/2005  DATE OF DISCHARGE:                                 OPERATIVE REPORT   PREOPERATIVE DIAGNOSIS:  Mitral valve prolapse with severe mitral  regurgitation, persistent atrial fibrillation.   POSTOPERATIVE DIAGNOSIS:  Mitral valve prolapse with severe mitral  regurgitation, persistent atrial fibrillation.   PROCEDURE:  Median sternotomy for mitral valve repair (quadrangular  resection of the posterior leaflet with sliding leaflet annuloplasty,  triangular plication of the anterior leaflet, 32 mm Medtronic ECG future  ring annuloplasty) and modified Cox maze IV procedure.   SURGEON:  Salvatore Decent. Cornelius Moras, M.D.   ASSISTANT:  Jerold Coombe, P.A.   ANESTHESIA:  General   CLINICAL NOTE:  The patient is a 60 year old gentleman from [place]  followed by Dr. Ovidio Hanger, referred by Dr. Randa Evens for  management of mitral valve prolapse with mitral regurgitation and atrial  fibrillation. The patient recently developed vague symptoms of shortness of  breath with activity and on evaluation by Dr. Tawanna Cooler was noted to have a heart  murmur on physical exam that was new in comparison with previous exams. He  underwent a 2-D echocardiogram on December6,2006 demonstrating mitral valve  prolapse with moderate to severe mitral regurgitation and dilatation of the  left atrium.  The patient subsequent underwent transesophageal  echocardiogram by Dr. Dietrich Pates on December13,2006. 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 There was also somewhat dilated  aortic root with only trace aortic insufficiency.  The patient underwent  left and right heart catheterization on December15,2006. This confirmed the  presence of mitral regurgitation. There was no significant coronary artery  disease. The patient was also noted to be in atrial fibrillation. He was  started on oral Amiodarone by Dr. Samule Ohm. The patient was referred for  elective mitral valve repair with possible concomitant Maze procedure.   OPERATIVE CONSENT:  The patient has been counseled at length regarding the  indications and potential benefits of mitral valve repair with possible  concomitant Maze procedure.  He understands and accepts all associated risks  of surgery, including but not limited, to risk of death, stroke, myocardial  infarction, congestive heart failure, respiratory failure, pneumonia,  bleeding requiring bleeding requiring blood transfusion, arrhythmia, heart  block with bradycardia requiring permanent pacemaker, possible need for  valve replacement if valve repair is not feasible.  He also understands that  follow-up CT angiogram of the descending aorta confirms the presence of  annuloaortic ectasia was a moderately dilated ascending aorta and aortic  root.  This was not large enough to require replacement of the aorta as yet,  although the patient probably is a somewhat increased risk for the  development of aortic dissection or possible late dilatation of his aorta,  perhaps requiring surgery at some point  a later date.  All their questions  have been addressed.   OPERATIVE FINDINGS:  1.  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  2.  Dilated left ventricle and severely dilated left atrium with normal left      ventricular systolic function.  3.  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  4.  Mild (1+) aortic  insufficiency.  5.  No residual mitral regurgitation after successful mitral valve repair.   DESCRIPTION OF PROCEDURE:  The patient is brought to the operating room and  above-mentioned date and central monitoring was established by the  anesthesia service under the care and direction of Dr. Autumn Patty.  Specifically, a Swan-Ganz catheter was placed through the right internal  jugular approach. A radial arterial line was placed. Intravenous antibiotics  were administered. Following induction with general endotracheal anesthesia,  a Foley catheter was placed. The patient's chest, abdomen, both groins, and  both lower extremities were prepared, draped in a sterile manner.   Baseline transesophageal echocardiogram was performed by Dr. Sampson Goon.  This confirms the presence of bileaflet prolapse with mitral valve with  severe prolapse of the middle scallop of the posterior leaflet.  There is at  least moderate to severe mitral regurgitation. The left atrium is dilated.  The left ventricle is dilated.  The left ventricular systolic function  appears normal.  The ascending aorta and the aortic root appear dilated. The  aortic valve was tricuspid. The aortic valve was somewhat asymmetrical in  shape and there is mild (1+) aortic insufficiency.   A median sternotomy incision was performed.  Pericardium was opened.  The  patient is heparinized systemically. The ascending aorta appears normal  other than is notably dilated with maximum transverse diameter approximately  4.2 cm. There is no atherosclerotic plaque or disease.  The ascending aorta  was cannulated for cardiopulmonary bypass uneventfully. The venous cannula  was placed in the superior vena cava.  A second venous cannula is placed low  in the right atrium with tip extending down the inferior vena cava.  A  retrograde cardioplegic catheter was placed through the right atrium into  the coronary sinus.  Cardiopulmonary bypass was  begun. Vessel loops were placed around the  superior vena cava and the  inferior vena cava. A temperature probe was  placed in the left ventricular septum. A cardioplegic catheter is placed in  the ascending aorta.   The patient is cooled to 32 degrees systemic temperature. The aortic  crossclamp was applied and cold blood cardioplegia was administered  initially in antegrade fashion through the aortic root.  Iced saline slush  was applied for topical hypothermia.  The initial cardioplegic arrest and  myocardial cooling are felt to be excellent. Supplemental cardioplegia  administered retrograde through the coronary sinus catheter.  Repeat doses  of cardioplegia administered intermittently throughout the cross clamp  portion of the operation retrograde to the coronary sinus catheter to  maintain left ventricular septal temperature below 15 degrees centigrade.   The heart was now gently retracted out of the pericardial space towards the  surgeon's side to expose the reflection of the left-sided pulmonary veins.  The left-sided pulmonary veins were encircled with a vessel loop.  The  Medtronic Cardioblate bipolar  irrigated radiofrequency ablation device was  utilized to create an elliptical ablation lesion around the base of the left-  sided pulmonary veins.  After this was complete a similar, elliptical  ablation lesion is created across the base of left atrial appendage.  Subsequently, the  Medtronic unipolar irrigated radiofrequency ablation pen  was utilized to create a short linear lesion joining the ellipse surrounding  the base of the left atrial appendage and with the ellipse surrounding the  left-sided pulmonary veins. This was performed using the unipolar probe on  the epicardial surface of the left atrium.   The heart was replaced into the pericardial space. A left atriotomy incision  was performed posteriorly through the intra-atrial groove. The atriotomy  incision was  continued inferiorly across the back wall left atrium towards  the mitral valve annulus. The floor of the left atrium was exposed using a  self-retaining retractor. The exposure was felt to be excellent. The  Medtronic bipolar irrigated radiofrequency ablation device was now utilized  to create a elliptical lesion around the base of the right-sided pulmonary  veins. The atriotomy incision serves of the anterior half of this ellipse,  where the posterior half of the ellipse was completed with one limb of the  ablation device along the endocardial surface of the left atrium and the  other along the epicardial surface posteriorly. Subsequently, a linear  lesion is created across the roof of the left atrium from the cephalad apex  of the left atriotomy incision across the grooved to reach the cephalad apex  of the elliptical lesion surrounding the base of the left-sided pulmonary  veins.  Similarly, another parallel linear ablation lesion is created across the back wall of left atrium from the inferior apex of the atriotomy  incision of to reach the inferior apex of the ellipse surrounding the left-  sided pulmonary veins. Each of these lesions were completed with one limb of  the bipolar device along the endocardial surface of the long the epicardial  surface.  Finally, another bipolar lesion is created from the inferior apex  of the atriotomy incision crossed back wall towards the left atrium towards  mitral valve annulus. The junction between the P2 and P3 portion of the  posterior the mitral valve leaflet.  This lesion is completed all the way  onto the mitral valve annulus using the unipolar irrigated radiofrequency  ablation pen along the endocardial surface left atrium. This completes the  left-sided lesion set of Cox Maze procedure.   The mitral valve was exposed using a self-retaining retractor.  Exposure was  felt to be excellent. The mitral valve was carefully analyzed. There is   myxomatous disease with bileaflet prolapse of the mitral valve. There is  severe prolapse of the middle scallop (P 2) of the posterior leaflet of  mitral valve. There are no ruptured cords.  The subvalvular apparatus  appears essentially normal with exception of the prolapsed segments  afflicting the anterior and posterior leaflets as described. Mitral valve  repair is felt to be technically feasible.  Initially, further exposure is  facilitated by placing 2-0 Ethibond horizontal mattress sutures around the  anterior half of the mitral valve annulus.  These sutures ultimately will be  used for ring annuloplasty.  The severe prolapse of the middle scallop of  the posterior leaflet mitral valve was now addressed.  A quadrangular  resection of the majority of the middle scallop (P 2) is performed. The  remaining portions of the posterior  leaflet on either side of the  quadrangular resection are now mobilized off of the posterior annulus to  facilitate sliding leaflet annuloplasty.  Several interrupted figure-of-  eight 2-0 Ethibond compression sutures were then placed in the posterior  annulus to shorten annular circumference and the remainder of the 2-0  Ethibond horizontal mattress annuloplasty sutures were placed around the  entire mitral valve annulus. The remaining portions of posterior leaflet are  reattached to the posterior annulus using a two-layer closure of running 4-0  Ethibond suture.  The remaining vertical defect in the posterior leaflet was  closed using interrupted 5-0 Ethibond everting simple sutures. The valve was  briefly tested by instilling iced saline into left ventricular chamber and  the valve appears to be competent although there is mild residual prolapse  in the middle scallop of the anterior leaflet without associated  regurgitation.  This is corrected using a small triangular plication of the  free margin of the anterior leaflet with 4-0 Ethibond sutures.  The  mitral valve annulus was sized to accept a 32-mm annuloplasty ring. This  size corresponds fairly precisely to the dimensions of the anterior leaflet  including the intertrigonal distance as well as the surface area of the  leaflet itself. A 32 mm Medtronic CT future ring (serial number [id number], K  [id number]) is secured in place uneventfully. After completion of the mitral valve  repair, the valve was tested by instilling iced saline into left ventricular  chamber. The valve appears to be perfectly competent. The surface area of  coaptation was carefully inspected and appears to be nice and symmetrical  with a broad area of this surface of coaptation of the anterior and  posteriors leaflet respectively. Rewarming was begun.   The left atrial appendage was over sewn from within the left atrium using a  two-layer closure of running 3-0 Prolene suture.   The tip of the right atrial appendage was amputated.  An oblique right  atriotomy incision was performed. The remainder of the right-sided lesion  set of Cox-Maze procedure was now completed. The bipolar irrigated  radiofrequency ablation device was utilized to create a series of linear  ablation lesions including a lesion extending from the posterior apex of the  right atriotomy incision is a cephalad direction onto the posterior portion  of the superior vena cava. A similar lesion was created in the opposite  direction from the same apex of the right atriotomy incision extending  inferiorly to reach the inferior vena cava.  Another linear lesion was then  created across the intra-atrial septum to reach the inferior rim of the  fossa ovalis.  This was completed with one limb of the ablation device in  the left atrium. The other limb of the right atrium.  Another bipolar lesion  is created from the tip the right eight total appendage extending in a  direction oriented perpendicular to the larger oblique right atriotomy  incision. This stopped  approximately 1.5 cm short of the atriotomy incision  itself. One last bipolar lesion is created from the tip of the right  atriotomy right atrial appendage oriented in the anterior medial direction  towards the acute margin of the heart.  The final remaining lesions set is  completed with the unipolar irrigated radiofrequency ablation pen along the  endocardial surface of the right atrium. A lesion is created from the  inferior rim of the fossa ovalis across the inferior rim of the coronary  sinus and on across the isthmus  of the heart to reach the tricuspid valve  annulus.  Another lesion is created from that apex of the right atriotomy  incision at the acute marginal heart, the short distance to reach the  anterolateral portion of the tricuspid valve annulus. One final short lesion  was created from the tip of the previous ablation lesion along the  anteromedial surface of the right atrial appendage to reach the anterior  medial portion of the tricuspid valve annulus.  This completes the entire Cox  Mays procedure set.   The patient is placed in Trendelenburg position. The left atriotomy incision  was closed using a two-layer closure of running 3-0 Prolene suture. One  final dose of warm retrograde hot shot cardioplegia was administered. The  lungs were ventilated and the heart allowed to fill to evacuate any residual  air through the aortic root. The aortic crossclamp was removed after a total  crossclamp time of 127 minutes.   The heart began to beat spontaneously without need for cardioversion. The  two remaining right atriotomy incisions were closed using a two-layer  closure of running 3-0 Prolene suture.  The retrograde cardioplegic catheter  was removed. The vessel loops around the superior and inferior vena cave are  removed.  Epicardial pacing wires were fixed to the right ventricular  outflow tract into the right atrial appendage.   The patient is rewarmed to 37 degrees  centigrade temperature.  The IVC  cannula was removed and his cannulation site over sewn with Prolene suture.  The patient is now weaned from cardiopulmonary bypass without difficulty.  The patient's rhythm at separation from bypass is junctional rhythm.  AV  sequential pacing was employed. Total cardiopulmonary bypass time for he  operation 159 minutes. Followup transesophageal echocardiogram performed by  Dr. Sampson Goon after separation from bypass demonstrates no residual mitral  regurgitation.  There appears to be a well seated mitral annuloplasty ring.  Acoustic window was seen suboptimal and the valves were not as well  visualized as there was preoperatively, but there is clearly no sign of any  regurgitant jet into the left atrium.  There remains mild aortic  insufficiency which is somewhat eccentrically located and courses along the  left ventricular surface of the anterior leaf of the mitral valve into the  left ventricular chamber.  Left ventricular function appears normal.  There  is insignificant residual air.  No other abnormalities were noted.   The SVC cannula was removed uneventfully.  The aortic root vent was removed.  The aortic cannula was removed uneventfully. Protamine was administered to  reverse the anticoagulation. The mediastinum was irrigated with saline  solution containing vancomycin. Meticulous surgical hemostasis ascertained.  There is mild coagulopathy. The mediastinum and the right pleural space were  drained using three chest tubes exited through separate stab incisions  inferiorly. The pericardium and soft tissues anterior to the at the aorta  are reapproximated.   The On-Q continuous pain management system is utilized for postoperative  pain control. Two 10 inch Silastic catheters supplied with the On-Q kit are  tunneled into the deep subcutaneous tissues located immediately anterior to  the costal cartilages just lateral to the lateral border of the  sternum oriented parallel to the orientation of the sternotomy. Each of these  catheters were flushed with 5 cc of 0.5 bupivacaine solution and ultimately  they are connected to continuous infusion pump.   Median sternotomy was closed with single strength sternal wire. The soft  tissues anterior sternal closed  in multiple layers and skin was closed with  a running subcuticular skin closure.   The patient tolerated the procedure well and is transported to the surgical  intensive care unit in stable condition. There are no intraoperative  complications. All sponge, instrument and needle counts were verified  correct at completion of the operation.  No blood products were  administered.      Salvatore Decent. Cornelius Moras, M.D.  Electronically Signed     CHO/MEDQ  D:  01/20/2005  T:  01/21/2005  Job:  [id number]   cc:   Salvadore Farber, M.D. [facility]  [address]  [address]  [place]  [place] [address]   Lurline Del

## 2010-05-30 NOTE — Op Note (Signed)
NAME:  [name], [name]                         ACCOUNT NO.:  [id number]   MEDICAL RECORD NO.:  [id number]                   PATIENT TYPE:  AMB   LOCATION:  [facility]C                                 FACILITY[facility]  [facility]   PHYSICIAN:  Mark C. Vernie Ammons, M.D.               DATE OF BIRTH:  11/26/1950   DATE OF PROCEDURE:  09/07/2003  DATE OF DISCHARGE:                                 OPERATIVE REPORT   PREOPERATIVE DIAGNOSES:  Gross hematuria.   POSTOPERATIVE DIAGNOSES:  1. Gross hematuria.  2. Right renal lesion.   PROCEDURE:  Cystoscopy, right retrograde pyelogram with interpretation,  right ureteroscopy with biopsy, brushing and barbotage of the lesion and  barbotage of the renal pelvis.   SURGEON:  Mark C. Vernie Ammons, M.D.   ANESTHESIA:  General.   ESTIMATED BLOOD LOSS:  Less than 5 mL.   DRAINS:  6 French 26 cm double J stent in the right ureter.   SPECIMENS:  1. Right renal pelvis barbotage washing.  2. Right renal lesion biopsied.  3. Brush biopsy of lesion.   COMPLICATIONS:  None.   INDICATIONS FOR PROCEDURE:  The patient is a 60 year old white male who one  week ago began having gross painless hematuria. He did have a CT scan that  revealed no evidence of stone, it was done without contrast.  Cystoscopy in  my office revealed gross blood coming from his right ureteral orifice. He is  brought to the OR today for further investigation.  He understands the  risks, complications, alternatives and limitations.   DESCRIPTION OF PROCEDURE:  After informed consent, the patient was brought  to the major OR, placed on the table, administered general anesthesia and  then moved to the dorsal lithotomy position.  His genitalia was sterilely  prepped and draped and a 22 French cystoscope sheath was then introduced  into the urethra and the 12 degree lens was inserted and urethra was noted  to be normal down to the sphincter which appears intact. The prostatic  urethra revealed elongation  and trilobar hypertrophy with a large median  lobe. Upon entering the bladder, I noted no tumor, stones or inflammatory  lesions.  Ureteral orifices were identified, the left was visualized and  noted to have clear efflux, the right had grossly bloody efflux.   A 6 French open ended ureteral catheter was then placed in the opening right  ureteral orifice and a right retrograde pyelogram was performed in a  standard fashion. The ureter was noted to be entirely normal throughout its  length with no hydronephrosis, filling defects or mass effect. The renal  pelvis appeared normal as did the collecting system.   A guidewire was then passed up into the area of the right kidney and a 6  French rigid ureteroscope was then passed next to the guidewire up the  entire ureter and it was completely visualized and noted to be free of  any  lesions or bleeding points.  I therefore removed that scope and attempted to  pass the ureteral access sheath but met some resistance so I dilated the  ureter somewhat with the balloon dilator and then was easily able to pass  the access sheath into the area of the UPJ.  The guidewire was removed and  the 6 French flexible ureteroscope was then passed up the access sheath into  the renal pelvis. I did a systematic search of all the calices.  He was  noted to have several complex papillae in the upper pole and lower pole that  actually appeared to be bifid in that they were attached by a thin band of  parenchymal appearing material which formed a bridge like appearance. During  my systematic search of the calices, I found a lower pole calix which was  the most superior calix of the lower pole complex system. There appeared to  be a nodular lesion of some form.  I did not see any bleeding from this but  it appeared highly suspicious. I therefore obtained a single biopsy of it  using the biopsy forceps through the flexible scope but its location made  passing any  instrument through the scope extremely difficult because it  would straighten the scope's passive deflection and moving the scope away  from the lesion. I was able to inject contrast into the collecting system  and could see the location, it appeared nearly normal, however, it was  visualized and it appeared abnormal therefore I used the brush and brushed  the area extensively. This was sent to pathology. I then emptied the renal  pelvis of all contrast and injected sterile saline into the renal pelvis and  barbotaged this and sent this as a barbotage specimen.   The guidewire was then reinserted and the cystoscope backloaded over the  guidewire. The 6 French stent was passed over the guidewire into the renal  pelvis and the guidewire was removed with good curl being noted in the renal  pelvis and bladder. The bladder was then drained. The patient was awakened  and taken to the recovery room in stable, satisfactory condition.  He  tolerated the procedure well with no known intraoperative complications.   He will be given a prescription for Pyridium 200 mg every six hours #36 as  well as Vicodin ES p.r.n. for pain #28.  He received 0.8 mg of Flomax in the  PACU and will be discharged after he voids later today. He will followup in  my office next week.  I will contact him with the biopsy report and of note  there was no string left on the stent.                                               Mark C. Vernie Ammons, M.D.    MCO/MEDQ  D:  09/07/2003  T:  09/08/2003  Job:  [id number]

## 2010-10-03 IMAGING — CR DG CHEST 2V
3 series · 3 of 3 positions shown · non-contrast
Comparison: 01/11/2007.

CLINICAL DATA: COPD.  Right posterior back pain.

CHEST - 2 VIEW

[view not recorded (1 of 3)]
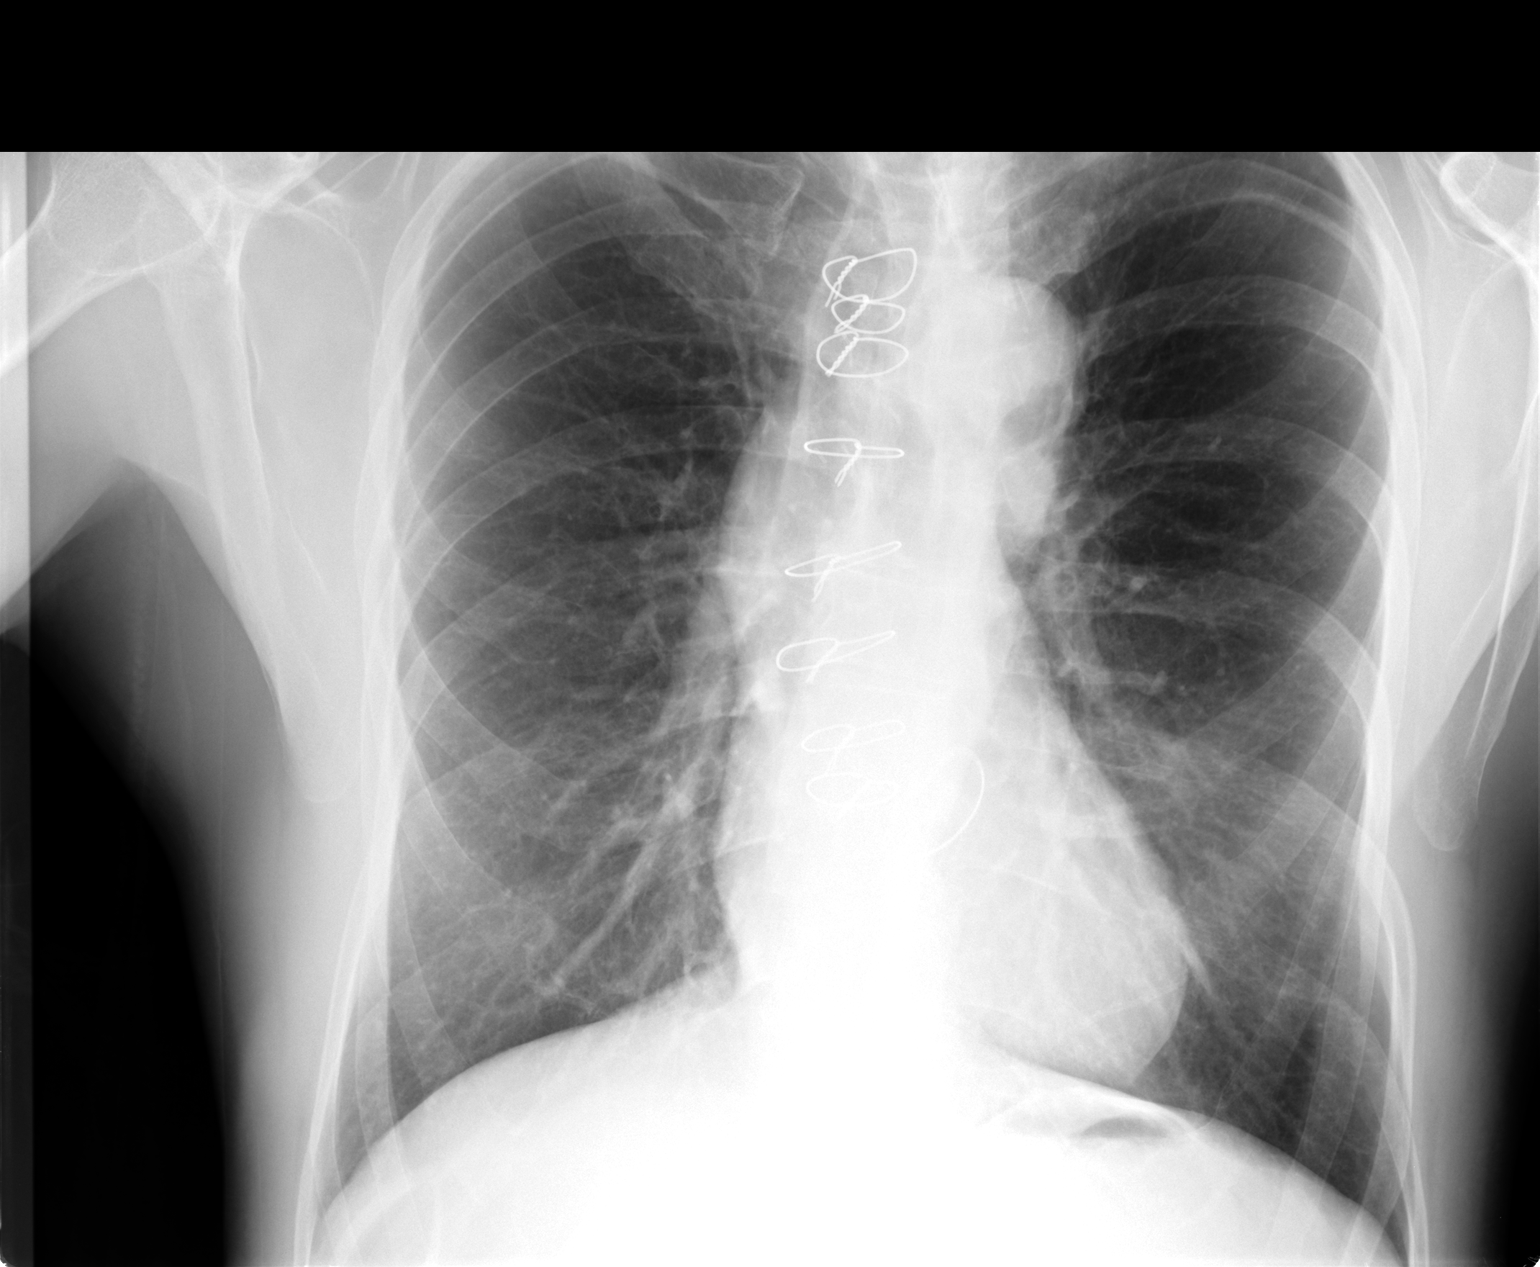

[view not recorded (2 of 3)]
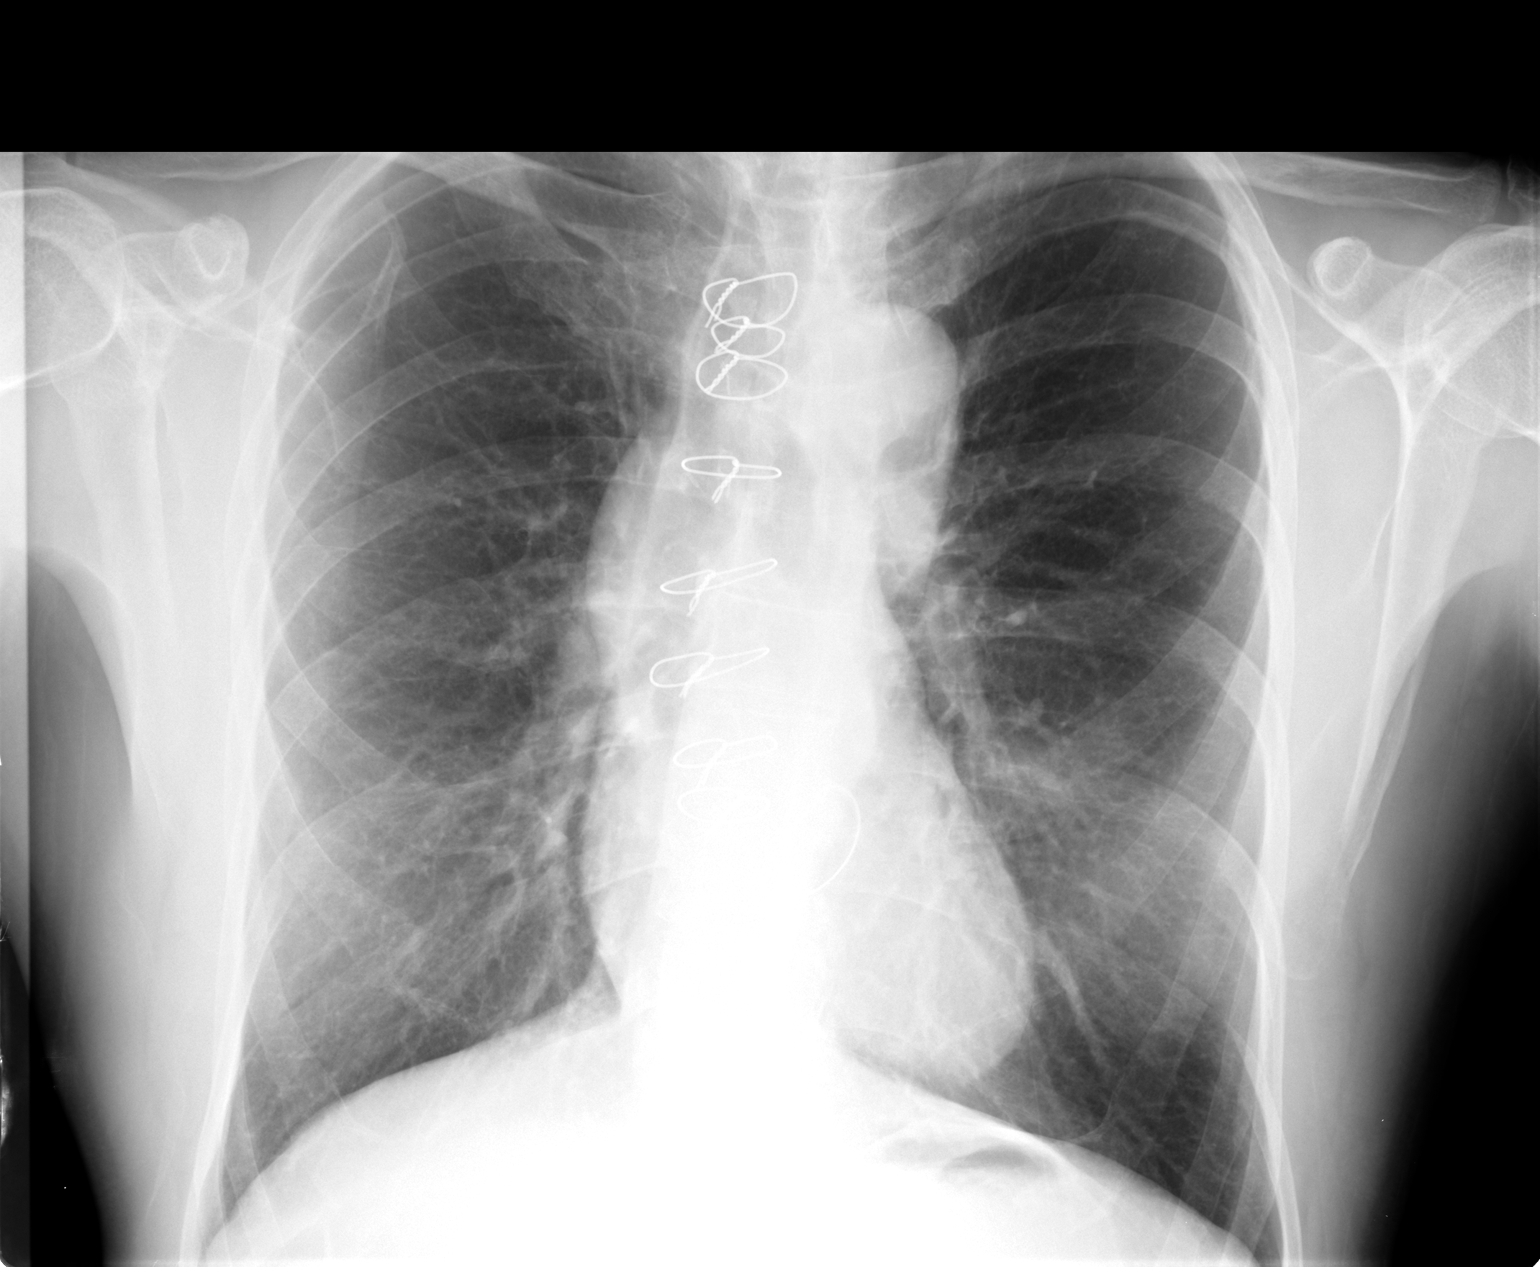

[view not recorded (3 of 3)]
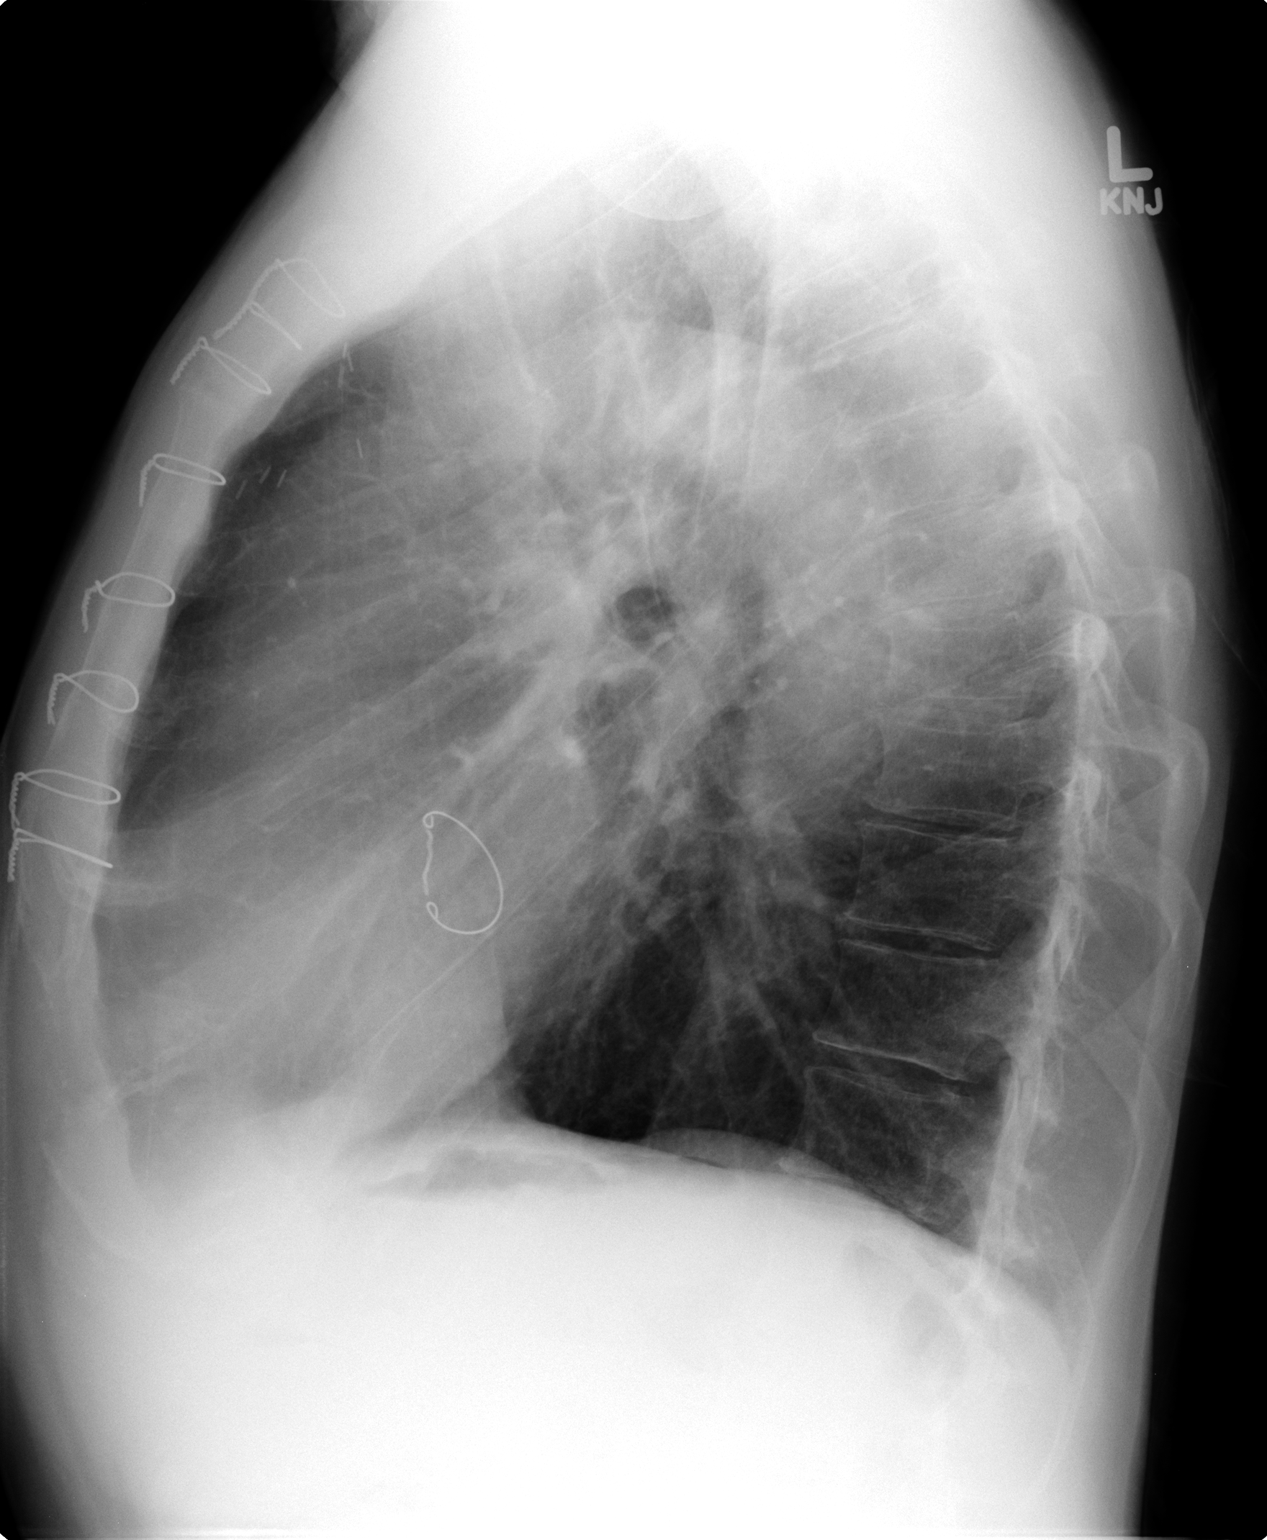

[3 of 3 positions shown; findings below may reference images not displayed]

FINDINGS: Hyperexpansion is consistent with emphysema. The lungs
are clear without focal infiltrate, edema or pleural effusion. The
cardiopericardial silhouette is within normal limits for size. The
patient is status post median sternotomy and mitral valve
replacement. Bones are diffusely demineralized.
IMPRESSION: Emphysema without acute cardiopulmonary findings.

## 2015-01-08 ENCOUNTER — Ambulatory Visit (INDEPENDENT_AMBULATORY_CARE_PROVIDER_SITE_OTHER): Payer: Non-veteran care | Admitting: Physician Assistant

## 2015-01-08 VITALS — BP 130/80 | HR 102 | Temp 98.0°F | Resp 19 | Ht 69.0 in | Wt 166.0 lb

## 2015-01-08 DIAGNOSIS — R34 Anuria and oliguria: Secondary | ICD-10-CM | POA: Diagnosis not present

## 2015-01-08 LAB — COMPLETE METABOLIC PANEL WITH GFR
ALT: 25 U/L (ref 9–46)
AST: 31 U/L (ref 10–35)
Albumin: 4 g/dL (ref 3.6–5.1)
Alkaline Phosphatase: 69 U/L (ref 40–115)
BILIRUBIN TOTAL: 0.7 mg/dL (ref 0.2–1.2)
BUN: 12 mg/dL (ref 7–25)
CHLORIDE: 98 mmol/L (ref 98–110)
CO2: 23 mmol/L (ref 20–31)
CREATININE: 0.8 mg/dL (ref 0.70–1.25)
Calcium: 9.6 mg/dL (ref 8.6–10.3)
GFR, Est African American: >89 mL/min (ref >=60)
GLUCOSE: 116 mg/dL — AB (ref 65–99)
Potassium: 4.2 mmol/L (ref 3.5–5.3)
SODIUM: 133 mmol/L — AB (ref 135–146)
TOTAL PROTEIN: 7.2 g/dL (ref 6.1–8.1)

## 2015-01-08 LAB — POC MICROSCOPIC URINALYSIS (UMFC): Mucus: ABSENT

## 2015-01-08 LAB — POCT URINALYSIS DIP (MANUAL ENTRY)
Bilirubin, UA: NEGATIVE
Blood, UA: NEGATIVE
GLUCOSE UA: NEGATIVE
Ketones, POC UA: NEGATIVE
LEUKOCYTES UA: NEGATIVE
Nitrite, UA: NEGATIVE
PROTEIN UA: NEGATIVE
Spec Grav, UA: 1.015
UROBILINOGEN UA: 0.2
pH, UA: 7

## 2015-01-08 LAB — POCT CBC
Granulocyte percent: 90.8 %G — AB (ref 37–80)
HCT, POC: 38.3 % — AB (ref 43.5–53.7)
HEMOGLOBIN: 13.2 g/dL — AB (ref 14.1–18.1)
LYMPH, POC: 0.6 (ref 0.6–3.4)
MCH: 31 pg (ref 27–31.2)
MCHC: 34.6 g/dL (ref 31.8–35.4)
MCV: 89.7 fL (ref 80–97)
MID (cbc): 0.2 (ref 0–0.9)
MPV: 7.1 fL (ref 0–99.8)
POC Granulocyte: 8.1 — AB (ref 2–6.9)
POC LYMPH PERCENT: 6.4 %L — AB (ref 10–50)
POC MID %: 2.8 % (ref 0–12)
Platelet Count, POC: 220 10*3/uL (ref 142–424)
RBC: 4.27 M/uL — AB (ref 4.69–6.13)
RDW, POC: 13.3 %
WBC: 8.9 10*3/uL (ref 4.6–10.2)

## 2015-01-08 MED ORDER — CIPROFLOXACIN HCL 500 MG PO TABS: 500.0000 mg | ORAL_TABLET | Freq: Two times a day (BID) | ORAL | Status: DC

## 2015-01-08 NOTE — Progress Notes (Signed)
01/09/2015 12:28 AM   DOB: 24-Jul-1950 / MRN: [id number]  SUBJECTIVE:  [name] is a 64 y.o. male presenting for inability to urinate since this morning.  Reports he was just at the [place] and was told that he has prostatitis and is still awaiting a phone call.  He is taking Flomax.  He complains of suprapubic pressure at this time as well as urethral burning.  Denies fever, chill and nausea.    He has no allergies on file.   He  has a past medical history of COPD (chronic obstructive pulmonary disease) ([place]).    He  reports that he has quit smoking. He does not have any smokeless tobacco history on file. He reports that he drinks alcohol. He  has no sexual activity history on file. The patient  has past surgical history that includes Appendectomy; Hernia repair; and Mitral valve repair.  His family history includes Cancer in his mother.  Review of Systems  Constitutional: Negative for fever and chills.  Cardiovascular: Negative for chest pain.  Skin: Negative for rash.  Neurological: Negative for headaches.    Problem list and medications reviewed and updated by myself where necessary, and exist elsewhere in the encounter.   OBJECTIVE:  BP 130/80 mmHg  Pulse 102  Temp(Src) 98 F (36.7 C) (Oral)  Resp 19  Ht '5\' 9"'  (1.753 m)  Wt 166 lb (75.297 kg)  BMI 24.50 kg/m2  SpO2 98%  Physical Exam  Abdominal: There is tenderness in the suprapubic area. There is no CVA tenderness, no tenderness at McBurney's point and negative Murphy's sign.    Results for orders placed or performed in visit on 01/08/15 (from the past 48 hour(s))  COMPLETE METABOLIC PANEL WITH GFR     Status: Abnormal   Collection Time: 01/08/15  3:06 PM  Result Value Ref Range   Sodium 133 (L) 135 - 146 mmol/L   Potassium 4.2 3.5 - 5.3 mmol/L   Chloride 98 98 - 110 mmol/L   CO2 23 20 - 31 mmol/L   Glucose, Bld 116 (H) 65 - 99 mg/dL   BUN 12 7 - 25 mg/dL   Creat 0.80 0.70 - 1.25 mg/dL   Total Bilirubin 0.7  0.2 - 1.2 mg/dL   Alkaline Phosphatase 69 40 - 115 U/L   AST 31 10 - 35 U/L   ALT 25 9 - 46 U/L   Total Protein 7.2 6.1 - 8.1 g/dL   Albumin 4.0 3.6 - 5.1 g/dL   Calcium 9.6 8.6 - 10.3 mg/dL   GFR, Est African American >89 >=60 mL/min   GFR, Est Non African American >89 >=60 mL/min    Comment:   The estimated GFR is a calculation valid for adults (>=31 years old) that uses the CKD-EPI algorithm to adjust for age and sex. It is   not to be used for children, pregnant women, hospitalized patients,    patients on dialysis, or with rapidly changing kidney function. According to the NKDEP, eGFR >89 is normal, 60-89 shows mild impairment, 30-59 shows moderate impairment, 15-29 shows severe impairment and <15 is ESRD.     PSA     Status: None (Preliminary result)   Collection Time: 01/08/15  3:06 PM  Result Value Ref Range   PSA  <=4.00 ng/mL  POCT Microscopic Urinalysis ([facility])     Status: Abnormal   Collection Time: 01/08/15  3:12 PM  Result Value Ref Range   WBC,UR,HPF,POC None None WBC/hpf   RBC,UR,HPF,POC  None None RBC/hpf   Bacteria None None, Too numerous to count   Mucus Absent Absent   Epithelial Cells, UR Per Microscopy Few (A) None, Too numerous to count cells/hpf  POCT CBC     Status: Abnormal   Collection Time: 01/08/15  3:12 PM  Result Value Ref Range   WBC 8.9 4.6 - 10.2 K/uL   Lymph, poc 0.6 0.6 - 3.4   POC LYMPH PERCENT 6.4 (A) 10 - 50 %L   MID (cbc) 0.2 0 - 0.9   POC MID % 2.8 0 - 12 %M   POC Granulocyte 8.1 (A) 2 - 6.9   Granulocyte percent 90.8 (A) 37 - 80 %G   RBC 4.27 (A) 4.69 - 6.13 M/uL   Hemoglobin 13.2 (A) 14.1 - 18.1 g/dL   HCT, POC 38.3 (A) 43.5 - 53.7 %   MCV 89.7 80 - 97 fL   MCH, POC 31.0 27 - 31.2 pg   MCHC 34.6 31.8 - 35.4 g/dL   RDW, POC 13.3 %   Platelet Count, POC 220 142 - 424 K/uL   MPV 7.1 0 - 99.8 fL  POCT urinalysis dipstick     Status: None   Collection Time: 01/08/15  3:12 PM  Result Value Ref Range   Color, UA yellow yellow    Clarity, UA clear clear   Glucose, UA negative negative   Bilirubin, UA negative negative   Ketones, POC UA negative negative   Spec Grav, UA 1.015    Blood, UA negative negative   pH, UA 7.0    Protein Ur, POC negative negative   Urobilinogen, UA 0.2    Nitrite, UA Negative Negative   Leukocytes, UA Negative Negative    ASSESSMENT AND PLAN  Beau was seen today for unable to void.  Diagnoses and all orders for this visit:  Anuria: Patient cathed here with a complete resolution of his symptoms. 600 mL drained. His CBC dose point to a bacterial pathology given granulocytosis.  He will try to get back to his urologist at the [facility] today or tomorrow.  I am starting Cipro to cover him for acute bacterial prostatitis. DRE avoided given risk of sepsis. If he can not get in with his urologist in the next day or two I have advised that he return on Thursday so we may further manage this problem. Will leave the cath in until Thursday or at the direction of his [place] urologist.  -     POCT Microscopic Urinalysis ([facility]) -     POCT CBC -     COMPLETE METABOLIC PANEL WITH GFR -     PSA -     POCT urinalysis dipstick -     ciprofloxacin (CIPRO) 500 MG tablet; Take 1 tablet (500 mg total) by mouth 2 (two) times daily.    The patient was advised to call or return to clinic if he does not see an improvement in symptoms or to seek the care of the closest emergency department if he worsens with the above plan.   Philis Fendt, MHS, PA-C [facility] 01/09/2015 12:28 AM

## 2015-01-09 LAB — PSA: PSA: 4 ng/mL (ref <=4.00)

## 2015-01-10 NOTE — Progress Notes (Signed)
  Medical screening examination/treatment/procedure(s) were performed by non-physician practitioner and as supervising physician I was immediately available for consultation/collaboration.     

## 2017-02-22 ENCOUNTER — Encounter: Payer: Self-pay | Admitting: Cardiology

## 2017-12-21 ENCOUNTER — Telehealth (HOSPITAL_COMMUNITY): Payer: Self-pay | Admitting: *Deleted

## 2017-12-21 NOTE — Telephone Encounter (Signed)
Received referral from Dr. Steva Ready at [place] is s/p 12/2 redo sternotomy AV Replacement and repair aneurysm.  Reviewed medical history in care every where.  Pt follow up with surgeon on 01/18/18 and additional follow up at the [place]?  Will have support staff create referral, send MD order, request 12 lead ekg and inquire for follow up at the [place].  Pt will need VA authorization for proceeding with scheduling CR.  Will continue to monitor pt for readiness. Cherre Huger, BSN Cardiac and Training and development officer

## 2017-12-23 ENCOUNTER — Telehealth (HOSPITAL_COMMUNITY): Payer: Self-pay

## 2017-12-23 NOTE — Telephone Encounter (Signed)
Pt is covered thru the [place].

## 2017-12-23 NOTE — Telephone Encounter (Signed)
Attempted to call patient in regards to Cardiac Rehab - LM on VM 

## 2018-02-26 ENCOUNTER — Encounter: Payer: Self-pay | Admitting: Cardiology

## 2018-02-26 NOTE — Progress Notes (Signed)
Cardiology Office Note   Date:  03/02/2018   ID:  [name], DOB 11/24/1950, MRN [id number]  PCP:  No primary care provider on file. Cardiologist:   No primary care provider on file. Referring:  Lorelei Pont, MD  Chief Complaint  Patient presents with  . AVR      History of Present Illness: [name] is a 68 y.o. male who presents for evaluation status post aortic valve and root replacement.  He wants to do cardiac rehab here.  He is referred by Lorelei Pont, MD  He is followed at [facility].  We saw him in the past for mitral valve repair which he had in 2007 by Dr. Roxy Manns.  He had severe aortic insufficiency with aortic root enlargement.  He had surgical repair at [facility].  He has requested cardiac rehab through [facility].  I reviewed these records.  This was done late last year.  Cardiac catheter prior to this demonstrated no significant disease.  Echocardiogram demonstrated normal left ventricular function with left ventricular hypertrophy.  EF was 50 to 55%.  He had an ascending aortic graft with root replacement and valve conduit.  Postop he apparently had atrial fibrillation.  I was able to review the records as above and including surgical note.  I do not see the discharge summary.  He is on amiodarone.  He is also on beta-blocker.  He came complaining of fatigue.  He had some soreness in his chest related to his sternal incision and he takes Neurontin.  He has been getting around a little bit in his house and is now approved to start cardiac rehab.  He denies any chest pressure, neck or arm discomfort.  He denies any new shortness of breath, PND or orthopnea.  He said no palpitations, presyncope or syncope.  He has had no weight gain or edema.  He has no fevers or chills.  He has no cough.   Past Medical History:  Diagnosis Date  . Aortic insufficiency   . BPH (benign prostatic hyperplasia)   . COPD (chronic obstructive pulmonary disease) (HCC)    Questionable diagonsis  . Dyslipidemia   . Thoracic aortic aneurysm [facility])     Past Surgical History:  Procedure Laterality Date  . AORTIC VALVE REPLACEMENT     28 mm Hemashield platinum vascular graft Medtronic freestyle for root 29 mm  . APPENDECTOMY    . HERNIA REPAIR    . MITRAL VALVE REPAIR    . THORACIC AORTIC SURGERY    . TRANSURETHRAL RESECTION OF PROSTATE       Current Outpatient Medications  Medication Sig Dispense Refill  . amiodarone (PACERONE) 200 MG tablet Take 1 tablet by mouth daily.    Marland Kitchen aspirin EC 81 MG tablet Take 1 tablet by mouth every other day.     . fluticasone (VERAMYST) 27.5 MCG/SPRAY nasal spray Place 1-2 sprays into the nose as needed.    Marland Kitchen HYDROcodone-acetaminophen (NORCO/VICODIN) 5-325 MG tablet Take 1 tablet by mouth daily.    . meloxicam (MOBIC) 7.5 MG tablet Take 1 tablet by mouth every other day.    . methocarbamol (ROBAXIN) 750 MG tablet Take 1-2 tablets by mouth daily.     . Omega-3 Fatty Acids (FISH OIL) 1000 MG CAPS Take 1 capsule by mouth daily.    Marland Kitchen omeprazole (PRILOSEC) 20 MG capsule Take 1 capsule by mouth daily.    . pravastatin (PRAVACHOL) 40 MG tablet Take 20 mg by mouth  daily.     . Vitamin D, Cholecalciferol, 50 MCG (2000 UT) CAPS Take 2,000 Units by mouth daily.    . metoprolol tartrate (LOPRESSOR) 25 MG tablet Take 0.5 tablets (12.5 mg total) by mouth 2 (two) times daily. 90 tablet 3   No current facility-administered medications for this visit.     Allergies:   Ciprofloxacin    Social History:  The patient  reports that he has quit smoking. He has never used smokeless tobacco. He reports current alcohol use. He reports current drug use. Drug: Benzodiazepines.   Family History:  The patient's family history includes Cancer in his father and mother.    ROS:  Please see the history of present illness.   Otherwise, review of systems are positive for none.   All other systems are reviewed and negative.    PHYSICAL EXAM: VS:  BP  122/74 (BP Location: Left Arm, Patient Position: Sitting, Cuff Size: Normal)   Pulse 67   Ht 5\' 10"  (1.778 m)   Wt 171 lb (77.6 kg)   BMI 24.54 kg/m  , BMI Body mass index is 24.54 kg/m. GENERAL:  Well appearing HEENT:  Pupils equal round and reactive, fundi not visualized, oral mucosa unremarkable NECK:  No jugular venous distention, waveform within normal limits, carotid upstroke brisk and symmetric, no bruits, no thyromegaly LYMPHATICS:  No cervical, inguinal adenopathy LUNGS:  Clear to auscultation bilaterally BACK:  No CVA tenderness CHEST:  Well healed surgical scar HEART:  PMI not displaced or sustained,S1 and S2 within normal limits, no S3, no S4, no clicks, no rubs, no murmurs ABD:  Flat, positive bowel sounds normal in frequency in pitch, no bruits, no rebound, no guarding, no midline pulsatile mass, no hepatomegaly, no splenomegaly EXT:  2 plus pulses throughout, no edema, no cyanosis no clubbing SKIN:  No rashes no nodules NEURO:  Cranial nerves II through XII grossly intact, motor grossly intact throughout PSYCH:  Cognitively intact, oriented to person place and time    EKG:  EKG is ordered today. The ekg ordered today demonstrates sinus rhythm, rate 67, left axis deviation, left anterior fascicular block, anterior T wave inversions without old EKGs recent for comparison.   Recent Labs: No results found for requested labs within last 8760 hours.    Lipid Panel    Component Value Date/Time   CHOL 206 (HH) 01/03/2008 0858   TRIG 72 01/03/2008 0858   TRIG 54 12/24/2005 0913   HDL 58.1 01/03/2008 0858   CHOLHDL 3.5 CALC 01/03/2008 0858   VLDL 14 01/03/2008 0858   LDLCALC 131 (H) 12/27/2006 0908   LDLDIRECT 122.5 01/03/2008 0858      Wt Readings from Last 3 Encounters:  03/01/18 171 lb (77.6 kg)  01/08/15 166 lb (75.3 kg)  01/10/08 170 lb (77.1 kg)      Other studies Reviewed: Additional studies/ records that were reviewed today include: Extensive records  from the [place]. Review of the above records demonstrates:  Please see elsewhere in the note.  NA   ASSESSMENT AND PLAN:  MVR:  This was stable on recent echo.  No change in therapy.  AVR/THORACIC AORTIC REPLACEMENT: He had this as above.  He is going to participate in cardiac rehab and knees an excellent candidate for this.  Otherwise no change in therapy.  FATIGUE: Given the fact that he is complaining of this I will reduce his metoprolol to 12 and half twice a day.  ATRIAL FIB: I am going to for his  management to his primary team.  I would like to discontinue his amiodarone if it is not done by them at the next follow-up.  I suspect this was only a postoperative atrial fib.  Current medicines are reviewed at length with the patient today.  The patient does not have concerns regarding medicines.  The following changes have been made:  no change  Labs/ tests ordered today include: None  Orders Placed This Encounter  Procedures  . EKG 12-Lead     Disposition:   FU with me as needed.      Signed, Minus Breeding, MD  03/02/2018 8:45 AM    [place]

## 2018-03-01 ENCOUNTER — Other Ambulatory Visit (HOSPITAL_COMMUNITY): Payer: Self-pay | Admitting: *Deleted

## 2018-03-01 ENCOUNTER — Encounter: Payer: Self-pay | Admitting: Cardiology

## 2018-03-01 ENCOUNTER — Ambulatory Visit (INDEPENDENT_AMBULATORY_CARE_PROVIDER_SITE_OTHER): Payer: No Typology Code available for payment source | Admitting: Cardiology

## 2018-03-01 ENCOUNTER — Telehealth (HOSPITAL_COMMUNITY): Payer: Self-pay | Admitting: *Deleted

## 2018-03-01 VITALS — BP 122/74 | HR 67 | Ht 70.0 in | Wt 171.0 lb

## 2018-03-01 DIAGNOSIS — Z9889 Other specified postprocedural states: Secondary | ICD-10-CM

## 2018-03-01 DIAGNOSIS — Z952 Presence of prosthetic heart valve: Secondary | ICD-10-CM

## 2018-03-01 DIAGNOSIS — Z954 Presence of other heart-valve replacement: Secondary | ICD-10-CM

## 2018-03-01 DIAGNOSIS — Z8679 Personal history of other diseases of the circulatory system: Secondary | ICD-10-CM

## 2018-03-01 MED ORDER — METOPROLOL TARTRATE 25 MG PO TABS: 12.5000 mg | ORAL_TABLET | Freq: Two times a day (BID) | ORAL | 3 refills | Status: AC

## 2018-03-01 NOTE — Patient Instructions (Addendum)
Medication Instructions:  DECREASE- Metoprolol Tartrate 12.5 mg twice a day  If you need a refill on your cardiac medications before your next appointment, please call your pharmacy.  Labwork: None Ordered   Testing/Procedures: None Ordered  Follow-Up: You will need a follow up appointment in 1 Year.  Please call our office 2 months in advance to schedule this appointment.  You may see Dr Percival Spanish or one of the following Advanced Practice Providers on your designated Care Team:   Rosaria Ferries, PA-C . Jory Sims, DNP, ANP    At [facility], you and your health needs are our priority.  As part of our continuing mission to provide you with exceptional heart care, we have created designated Provider Care Teams.  These Care Teams include your primary Cardiologist (physician) and Advanced Practice Providers (APPs -  Physician Assistants and Nurse Practitioners) who all work together to provide you with the care you need, when you need it.  Thank you for choosing [facility] at [facility]!!

## 2018-03-01 NOTE — Telephone Encounter (Signed)
Received phone call from Dr. Percival Spanish office regarding scheduling for Cardiac Rehab.  Pt was referred to Card[facility] by his heart surgeon - Dr. Steva [facility] at [facility].  Referral order was received signed and we were awaiting 12 lead EKG tracing.  Advised by [place] that Dr. Percival Spanish is now seeing this pt.  Pt seen today in the office and is progressing as expected. Pt also completed his follow up with surgeon on 01/18/18 reviewed in care everywhere.  Will place electronic referral pending Dr. Percival Spanish signature.  Called and left message for pt. Cherre Huger, BSN Cardiac and Training and development officer

## 2018-03-02 ENCOUNTER — Encounter: Payer: Self-pay | Admitting: Cardiology

## 2018-03-08 ENCOUNTER — Telehealth (HOSPITAL_COMMUNITY): Payer: Self-pay

## 2018-03-08 NOTE — Telephone Encounter (Signed)
Attempted to call patient in regards to Cardiac Rehab - LM on VM 

## 2018-03-09 NOTE — Telephone Encounter (Signed)
Pt called and schedule CR, pt will come in for orientation on 03/24/2018 @ 8AM and will attend the 1:15PM class.  Mailed homework package.

## 2018-03-21 ENCOUNTER — Telehealth (HOSPITAL_COMMUNITY): Payer: Self-pay | Admitting: Pharmacist

## 2018-03-23 ENCOUNTER — Telehealth (HOSPITAL_COMMUNITY): Payer: Self-pay | Admitting: *Deleted

## 2018-03-23 NOTE — Progress Notes (Signed)
[name] 68 y.o. male DOB 1950/01/16 MRN [id number]       Nutrition Screen Note  No diagnosis found. Past Medical History:  Diagnosis Date  . Aortic insufficiency   . BPH (benign prostatic hyperplasia)   . COPD (chronic obstructive pulmonary disease) (HCC)    Questionable diagonsis  . Dyslipidemia   . Thoracic aortic aneurysm ([place])    Meds reviewed.     Current Outpatient Medications (Cardiovascular):  .  amiodarone (PACERONE) 200 MG tablet, Take 1 tablet by mouth daily. .  metoprolol tartrate (LOPRESSOR) 25 MG tablet, Take 0.5 tablets (12.5 mg total) by mouth 2 (two) times daily. .  pravastatin (PRAVACHOL) 40 MG tablet, Take 20 mg by mouth daily.   Current Outpatient Medications (Respiratory):  .  fluticasone (VERAMYST) 27.5 MCG/SPRAY nasal spray, Place 1-2 sprays into the nose as needed.  Current Outpatient Medications (Analgesics):  .  aspirin EC 81 MG tablet, Take 1 tablet by mouth every other day.  Marland Kitchen  HYDROcodone-acetaminophen (NORCO/VICODIN) 5-325 MG tablet, Take 1 tablet by mouth daily. .  meloxicam (MOBIC) 7.5 MG tablet, Take 1 tablet by mouth every other day.   Current Outpatient Medications (Other):  .  methocarbamol (ROBAXIN) 750 MG tablet, Take 1-2 tablets by mouth daily.  .  Omega-3 Fatty Acids (FISH OIL) 1000 MG CAPS, Take 1 capsule by mouth daily. Marland Kitchen  omeprazole (PRILOSEC) 20 MG capsule, Take 1 capsule by mouth daily. .  Vitamin D, Cholecalciferol, 50 MCG (2000 UT) CAPS, Take 2,000 Units by mouth daily.   HT: Ht Readings from Last 1 Encounters:  03/01/18 5\' 10"  (1.778 m)    WT: Wt Readings from Last 5 Encounters:  03/01/18 171 lb (77.6 kg)  01/08/15 166 lb (75.3 kg)  01/10/08 170 lb (77.1 kg)     BMI = 24.54   Current tobacco use? No       Labs:  Lipid Panel     Component Value Date/Time   CHOL 206 (HH) 01/03/2008 0858   TRIG 72 01/03/2008 0858   TRIG 54 12/24/2005 0913   HDL 58.1 01/03/2008 0858   CHOLHDL 3.5 CALC 01/03/2008 0858   VLDL 14 01/03/2008 0858   LDLCALC 131 (H) 12/27/2006 0908   LDLDIRECT 122.5 01/03/2008 0858    No results found for: HGBA1C CBG (last 3)  No results for input(s): GLUCAP in the last 72 hours.  Nutrition Diagnosis ? Food-and nutrition-related knowledge deficit related to lack of exposure to information as related to diagnosis of: ? CVD  Nutrition Goal(s):  ? To be determined  Plan:  Pt to attend nutrition classes ? Nutrition I ? Nutrition II ? Portion Distortion  Will provide client-centered nutrition education as part of interdisciplinary care.   Monitor and evaluate progress toward nutrition goal with team.  Daryl Bustle, MS, RD, LDN 03/23/2018 10:53 AM

## 2018-03-23 NOTE — Telephone Encounter (Signed)
Pt called to see if he needed to cancel his orientation appt. Pt seen on yesterday by his primary MD at [facility]. Pt noticed on yesterday that he had a "bulge" at the end of his sternal incision.  Pt is s/p Redo Sternotomy AVR at [facility] in November 2019.  Per Simona Huh his primary Md will need a general surgeon consult for repair of the herniation.  Pt describes about a flattened soft ball size bulge that disappears when he lays down.  The area is "semi hard" and is not painful to touch.  Agreeable, pt is frustrated . He was not aware that he was not to lift 20-30 lbs.  Reviewed sternal precautions and staying in the "tube". [place] for pt to walk on level surface. Pt advised that yes his cardiac rehab will be put on hold pending the surgical consult and plan of care.  Pt verbalized understanding and will keep rehab staff updated on his scheduled appt.  Direct contact information provided. Cherre Huger, BSN Cardiac and Training and development officer

## 2018-03-24 ENCOUNTER — Inpatient Hospital Stay (HOSPITAL_COMMUNITY)
Admission: RE | Admit: 2018-03-24 | Discharge: 2018-03-24 | Disposition: A | Discharge status: Home / Self Care | Payer: Non-veteran care | Source: Ambulatory Visit

## 2018-04-01 ENCOUNTER — Telehealth (HOSPITAL_COMMUNITY): Payer: Self-pay | Admitting: *Deleted

## 2018-04-01 ENCOUNTER — Ambulatory Visit (HOSPITAL_COMMUNITY): Payer: Non-veteran care

## 2018-04-01 NOTE — Telephone Encounter (Signed)
Pt returned call from message left previously.  Called to see if pt had made connection with general surgeon at the [place].  Mr. Forand indicated that he had seen someone on yesterday.  Pt will need CT.  Per pt, the surgery would not take place right away - several months to ensure that he is medically stable for surgery possible robotic in [place].  Pt has appt at the end of April with cardiology.  Will continue to follow pt.  Advised that we are currently closed due to Covid-19 and once he is cleared to participate and we are scheduling he will be contacted.  Verbalized understanding. Cherre Huger, BSN Cardiac and Training and development officer

## 2018-04-04 ENCOUNTER — Ambulatory Visit (HOSPITAL_COMMUNITY): Payer: Non-veteran care

## 2018-04-06 ENCOUNTER — Ambulatory Visit (HOSPITAL_COMMUNITY): Payer: Non-veteran care

## 2018-04-08 ENCOUNTER — Ambulatory Visit (HOSPITAL_COMMUNITY): Payer: Non-veteran care

## 2018-04-11 ENCOUNTER — Ambulatory Visit (HOSPITAL_COMMUNITY): Payer: Non-veteran care

## 2018-04-13 ENCOUNTER — Ambulatory Visit (HOSPITAL_COMMUNITY): Payer: Non-veteran care

## 2018-04-15 ENCOUNTER — Ambulatory Visit (HOSPITAL_COMMUNITY): Payer: Non-veteran care

## 2018-04-18 ENCOUNTER — Ambulatory Visit (HOSPITAL_COMMUNITY): Payer: Non-veteran care

## 2018-04-20 ENCOUNTER — Ambulatory Visit (HOSPITAL_COMMUNITY): Payer: Non-veteran care

## 2018-04-22 ENCOUNTER — Ambulatory Visit (HOSPITAL_COMMUNITY): Payer: Non-veteran care

## 2018-04-25 ENCOUNTER — Ambulatory Visit (HOSPITAL_COMMUNITY): Payer: Non-veteran care

## 2018-04-26 ENCOUNTER — Telehealth (HOSPITAL_COMMUNITY): Payer: Self-pay | Admitting: *Deleted

## 2018-04-26 NOTE — Telephone Encounter (Signed)
Called and left message asking for general well being and any updates for his robotic surgery for repair of his sternal incision "herniation".  Requested call back. Cherre Huger, BSN Cardiac and Training and development officer

## 2018-04-27 ENCOUNTER — Ambulatory Visit (HOSPITAL_COMMUNITY): Payer: Non-veteran care

## 2018-04-29 ENCOUNTER — Ambulatory Visit (HOSPITAL_COMMUNITY): Payer: Non-veteran care

## 2018-05-02 ENCOUNTER — Ambulatory Visit (HOSPITAL_COMMUNITY): Payer: Non-veteran care

## 2018-05-04 ENCOUNTER — Ambulatory Visit (HOSPITAL_COMMUNITY): Payer: Non-veteran care

## 2018-05-06 ENCOUNTER — Ambulatory Visit (HOSPITAL_COMMUNITY): Payer: Non-veteran care

## 2018-05-09 ENCOUNTER — Ambulatory Visit (HOSPITAL_COMMUNITY): Payer: Non-veteran care

## 2018-05-11 ENCOUNTER — Ambulatory Visit (HOSPITAL_COMMUNITY): Payer: Non-veteran care

## 2018-05-11 ENCOUNTER — Telehealth (HOSPITAL_COMMUNITY): Payer: Self-pay | Admitting: *Deleted

## 2018-05-11 NOTE — Telephone Encounter (Signed)
Called and left 2nd message requesting please contact.  Inquiring regarding needed surgery to repair herniated area below sternal incision.  Contact information provided. Cherre Huger, BSN Cardiac and Training and development officer

## 2018-05-13 ENCOUNTER — Ambulatory Visit (HOSPITAL_COMMUNITY): Payer: Non-veteran care

## 2018-05-16 ENCOUNTER — Ambulatory Visit (HOSPITAL_COMMUNITY): Payer: Non-veteran care

## 2018-05-18 ENCOUNTER — Ambulatory Visit (HOSPITAL_COMMUNITY): Payer: Non-veteran care

## 2018-05-20 ENCOUNTER — Ambulatory Visit (HOSPITAL_COMMUNITY): Payer: Non-veteran care

## 2018-05-23 ENCOUNTER — Ambulatory Visit (HOSPITAL_COMMUNITY): Payer: Non-veteran care

## 2018-05-25 ENCOUNTER — Ambulatory Visit (HOSPITAL_COMMUNITY): Payer: Non-veteran care

## 2018-05-27 ENCOUNTER — Ambulatory Visit (HOSPITAL_COMMUNITY): Payer: Non-veteran care

## 2018-05-30 ENCOUNTER — Ambulatory Visit (HOSPITAL_COMMUNITY): Payer: Non-veteran care

## 2018-06-01 ENCOUNTER — Ambulatory Visit (HOSPITAL_COMMUNITY): Payer: Non-veteran care

## 2018-06-03 ENCOUNTER — Ambulatory Visit (HOSPITAL_COMMUNITY): Payer: Non-veteran care

## 2018-06-08 ENCOUNTER — Ambulatory Visit (HOSPITAL_COMMUNITY): Payer: Non-veteran care

## 2018-06-10 ENCOUNTER — Ambulatory Visit (HOSPITAL_COMMUNITY): Payer: Non-veteran care

## 2018-06-13 ENCOUNTER — Ambulatory Visit (HOSPITAL_COMMUNITY): Payer: Non-veteran care

## 2018-06-15 ENCOUNTER — Ambulatory Visit (HOSPITAL_COMMUNITY): Payer: Non-veteran care

## 2018-06-17 ENCOUNTER — Ambulatory Visit (HOSPITAL_COMMUNITY): Payer: Non-veteran care

## 2018-06-20 ENCOUNTER — Ambulatory Visit (HOSPITAL_COMMUNITY): Payer: Non-veteran care

## 2018-06-22 ENCOUNTER — Ambulatory Visit (HOSPITAL_COMMUNITY): Payer: Non-veteran care

## 2018-06-24 ENCOUNTER — Ambulatory Visit (HOSPITAL_COMMUNITY): Payer: Non-veteran care

## 2018-06-27 ENCOUNTER — Ambulatory Visit (HOSPITAL_COMMUNITY): Payer: Non-veteran care

## 2018-06-29 ENCOUNTER — Ambulatory Visit (HOSPITAL_COMMUNITY): Payer: Non-veteran care

## 2018-07-01 ENCOUNTER — Ambulatory Visit (HOSPITAL_COMMUNITY): Payer: Non-veteran care

## 2018-07-04 ENCOUNTER — Ambulatory Visit (HOSPITAL_COMMUNITY): Payer: Non-veteran care

## 2018-07-08 ENCOUNTER — Telehealth (HOSPITAL_COMMUNITY): Payer: Self-pay

## 2018-07-08 NOTE — Telephone Encounter (Signed)
No response from pt, closed referral. °
